# Patient Record
Sex: Female | Born: 1945 | Race: White | Hispanic: No | Marital: Married | Smoking: Never smoker
Health system: Southern US, Community
[De-identification: ages and names within clinical notes are randomized; demographics above are authoritative.]

## PROBLEM LIST (undated history)

## (undated) DIAGNOSIS — J309 Allergic rhinitis, unspecified: Secondary | ICD-10-CM

## (undated) DIAGNOSIS — I10 Essential (primary) hypertension: Secondary | ICD-10-CM

## (undated) DIAGNOSIS — H02889 Meibomian gland dysfunction of unspecified eye, unspecified eyelid: Secondary | ICD-10-CM

## (undated) DIAGNOSIS — M1990 Unspecified osteoarthritis, unspecified site: Secondary | ICD-10-CM

## (undated) DIAGNOSIS — R0600 Dyspnea, unspecified: Secondary | ICD-10-CM

## (undated) DIAGNOSIS — M67971 Unspecified disorder of synovium and tendon, right ankle and foot: Secondary | ICD-10-CM

## (undated) DIAGNOSIS — R591 Generalized enlarged lymph nodes: Secondary | ICD-10-CM

## (undated) DIAGNOSIS — E785 Hyperlipidemia, unspecified: Secondary | ICD-10-CM

## (undated) DIAGNOSIS — F419 Anxiety disorder, unspecified: Secondary | ICD-10-CM

## (undated) DIAGNOSIS — G4730 Sleep apnea, unspecified: Secondary | ICD-10-CM

## (undated) DIAGNOSIS — G4733 Obstructive sleep apnea (adult) (pediatric): Secondary | ICD-10-CM

## (undated) DIAGNOSIS — N950 Postmenopausal bleeding: Secondary | ICD-10-CM

## (undated) DIAGNOSIS — H02839 Dermatochalasis of unspecified eye, unspecified eyelid: Secondary | ICD-10-CM

## (undated) DIAGNOSIS — M722 Plantar fascial fibromatosis: Secondary | ICD-10-CM

## (undated) DIAGNOSIS — R011 Cardiac murmur, unspecified: Secondary | ICD-10-CM

## (undated) HISTORY — DX: Obstructive sleep apnea (adult) (pediatric): G47.33

## (undated) HISTORY — DX: Meibomian gland dysfunction of unspecified eye, unspecified eyelid: H02.889

## (undated) HISTORY — DX: Anxiety disorder, unspecified: F41.9

## (undated) HISTORY — PX: COLON SURGERY: SHX602

## (undated) HISTORY — PX: CATARACT EXTRACTION: SUR2

## (undated) HISTORY — DX: Allergic rhinitis, unspecified: J30.9

## (undated) HISTORY — DX: Essential (primary) hypertension: I10

## (undated) HISTORY — DX: Hyperlipidemia, unspecified: E78.5

## (undated) HISTORY — DX: Dermatochalasis of unspecified eye, unspecified eyelid: H02.839

---

## 1995-02-07 HISTORY — PX: CHOLECYSTECTOMY: SHX55

## 1999-02-07 HISTORY — PX: COLECTOMY: SHX59

## 2004-09-01 ENCOUNTER — Ambulatory Visit: Payer: Self-pay | Admitting: Internal Medicine

## 2006-01-18 ENCOUNTER — Ambulatory Visit: Payer: Self-pay | Admitting: Internal Medicine

## 2006-02-05 ENCOUNTER — Ambulatory Visit: Payer: Self-pay | Admitting: Gastroenterology

## 2006-02-06 HISTORY — PX: EYE SURGERY: SHX253

## 2006-02-06 HISTORY — PX: HYSTEROSCOPY: SHX211

## 2006-05-17 ENCOUNTER — Ambulatory Visit: Payer: Self-pay | Admitting: Obstetrics and Gynecology

## 2006-05-17 ENCOUNTER — Other Ambulatory Visit: Payer: Self-pay

## 2006-05-25 ENCOUNTER — Ambulatory Visit: Payer: Self-pay | Admitting: Obstetrics and Gynecology

## 2007-01-28 ENCOUNTER — Ambulatory Visit: Payer: Self-pay | Admitting: Internal Medicine

## 2008-03-30 ENCOUNTER — Ambulatory Visit: Payer: Self-pay | Admitting: Internal Medicine

## 2009-04-08 ENCOUNTER — Ambulatory Visit: Payer: Self-pay | Admitting: Gastroenterology

## 2010-02-06 HISTORY — PX: CARPAL TUNNEL RELEASE: SHX101

## 2010-02-24 ENCOUNTER — Ambulatory Visit: Payer: Self-pay | Admitting: Internal Medicine

## 2010-03-01 ENCOUNTER — Ambulatory Visit: Payer: Self-pay | Admitting: Unknown Physician Specialty

## 2010-03-09 ENCOUNTER — Ambulatory Visit: Payer: Self-pay | Admitting: Unknown Physician Specialty

## 2012-05-21 ENCOUNTER — Ambulatory Visit: Payer: Self-pay | Admitting: Internal Medicine

## 2013-02-06 HISTORY — PX: SHOULDER OPEN ROTATOR CUFF REPAIR: SHX2407

## 2013-02-06 HISTORY — PX: BLEPHAROPLASTY: SUR158

## 2013-11-17 ENCOUNTER — Emergency Department: Payer: Self-pay | Admitting: Emergency Medicine

## 2014-06-11 ENCOUNTER — Encounter: Payer: Self-pay | Admitting: Anesthesiology

## 2014-06-11 ENCOUNTER — Ambulatory Visit: Payer: Medicare Other | Admitting: Anesthesiology

## 2014-06-11 ENCOUNTER — Encounter
Admission: RE | Disposition: A | Discharge status: Home / Self Care | Payer: Self-pay | Source: Ambulatory Visit | Attending: Gastroenterology

## 2014-06-11 ENCOUNTER — Ambulatory Visit
Admission: RE | Admit: 2014-06-11 | Discharge: 2014-06-11 | Disposition: A | Discharge status: Home / Self Care | Payer: Medicare Other | Source: Ambulatory Visit | Attending: Gastroenterology | Admitting: Gastroenterology

## 2014-06-11 DIAGNOSIS — Z86010 Personal history of colonic polyps: Secondary | ICD-10-CM | POA: Insufficient documentation

## 2014-06-11 DIAGNOSIS — Z8371 Family history of colonic polyps: Secondary | ICD-10-CM | POA: Insufficient documentation

## 2014-06-11 DIAGNOSIS — I10 Essential (primary) hypertension: Secondary | ICD-10-CM | POA: Diagnosis present

## 2014-06-11 DIAGNOSIS — K5730 Diverticulosis of large intestine without perforation or abscess without bleeding: Secondary | ICD-10-CM | POA: Diagnosis not present

## 2014-06-11 DIAGNOSIS — Z882 Allergy status to sulfonamides status: Secondary | ICD-10-CM | POA: Diagnosis not present

## 2014-06-11 DIAGNOSIS — Z9889 Other specified postprocedural states: Secondary | ICD-10-CM | POA: Insufficient documentation

## 2014-06-11 DIAGNOSIS — Z792 Long term (current) use of antibiotics: Secondary | ICD-10-CM | POA: Diagnosis present

## 2014-06-11 DIAGNOSIS — Z881 Allergy status to other antibiotic agents status: Secondary | ICD-10-CM | POA: Insufficient documentation

## 2014-06-11 DIAGNOSIS — Z888 Allergy status to other drugs, medicaments and biological substances status: Secondary | ICD-10-CM | POA: Insufficient documentation

## 2014-06-11 DIAGNOSIS — D123 Benign neoplasm of transverse colon: Secondary | ICD-10-CM | POA: Insufficient documentation

## 2014-06-11 DIAGNOSIS — G4730 Sleep apnea, unspecified: Secondary | ICD-10-CM | POA: Diagnosis not present

## 2014-06-11 DIAGNOSIS — D124 Benign neoplasm of descending colon: Secondary | ICD-10-CM | POA: Insufficient documentation

## 2014-06-11 HISTORY — PX: COLONOSCOPY: SHX5424

## 2014-06-11 SURGERY — COLONOSCOPY
Anesthesia: Monitor Anesthesia Care

## 2014-06-11 MED ORDER — EPHEDRINE SULFATE 50 MG/ML IJ SOLN
INTRAMUSCULAR | Status: DC | PRN
  Administered 2014-06-11 (×2): 15 mg via INTRAVENOUS
  Administered 2014-06-11: 10 mg via INTRAVENOUS

## 2014-06-11 MED ORDER — LACTATED RINGERS IV SOLN
INTRAVENOUS | Status: DC | PRN
  Administered 2014-06-11: 10:00:00 via INTRAVENOUS

## 2014-06-11 MED ORDER — SODIUM CHLORIDE 0.9 % IV SOLN
INTRAVENOUS | Status: DC
  Administered 2014-06-11: 1000 mL via INTRAVENOUS

## 2014-06-11 MED ORDER — PROPOFOL 10 MG/ML IV BOLUS
INTRAVENOUS | Status: DC | PRN
  Administered 2014-06-11: 300 ug via INTRAVENOUS

## 2014-06-11 MED ORDER — MIDAZOLAM HCL 2 MG/2ML IJ SOLN
INTRAMUSCULAR | Status: DC | PRN
  Administered 2014-06-11: 1 mg via INTRAVENOUS

## 2014-06-11 MED ORDER — PROPOFOL INFUSION 10 MG/ML OPTIME
INTRAVENOUS | Status: DC | PRN
  Administered 2014-06-11: 100 ug/kg/min via INTRAVENOUS

## 2014-06-11 MED ORDER — FENTANYL CITRATE (PF) 100 MCG/2ML IJ SOLN
INTRAMUSCULAR | Status: DC | PRN
  Administered 2014-06-11: 50 ug via INTRAVENOUS

## 2014-06-11 NOTE — Anesthesia Postprocedure Evaluation (Signed)
  Anesthesia Post-op Note  Patient: Dawn Oliver  Procedure(s) Performed: Procedure(s): COLONOSCOPY (N/A)  Anesthesia type:MAC, General  Patient location: PACU  Post pain: Pain level controlled  Post assessment: Post-op Vital signs reviewed, Patient's Cardiovascular Status Stable, Respiratory Function Stable, Patent Airway and No signs of Nausea or vomiting  Post vital signs: Reviewed and stable  Last Vitals:  Filed Vitals:   06/11/14 1120  BP: 134/57  Pulse: 63  Temp:   Resp: 17    Level of consciousness: awake, alert  and patient cooperative  Complications: No apparent anesthesia complications

## 2014-06-11 NOTE — Transfer of Care (Signed)
Immediate Anesthesia Transfer of Care Note  Patient: Dawn Oliver  Procedure(s) Performed: Procedure(s): COLONOSCOPY (N/A)  Patient Location: PACU  Anesthesia Type:MAC  Level of Consciousness: awake and alert   Airway & Oxygen Therapy: Patient Spontanous Breathing  Post-op Assessment: Report given to RN  Post vital signs: Reviewed and stable  Last Vitals:  Filed Vitals:   06/11/14 1053  BP: 109/50  Pulse: 69  Temp: 37 C  Resp: 18    Complications: No apparent anesthesia complications

## 2014-06-11 NOTE — H&P (Signed)
Personal and family hx of polyps  Allergies: atorvastatin, biaxin lisinopril, zetia, sulfa, e-mycin  PMH; sleep apnea  PSH: rotator cuff repair  PE: VSS, NAD  HEENT:wnl CV:RRR Lungs:CTA Abdomen: NABS, soft, nontender, -HSM Ext:-C/C/E  Imp: Family Hx of polyps.  Plan: colonoscopy

## 2014-06-11 NOTE — Anesthesia Preprocedure Evaluation (Signed)
Anesthesia Evaluation  Patient identified by MRN, date of birth, ID band Patient awake    Reviewed: Allergy & Precautions, NPO status , Patient's Chart, lab work & pertinent test results, reviewed documented beta blocker date and time   Airway Mallampati: III  TM Distance: >3 FB Neck ROM: Full    Dental  (+) Chipped   Pulmonary          Cardiovascular hypertension, Pt. on medications and Pt. on home beta blockers     Neuro/Psych Anxiety    GI/Hepatic   Endo/Other    Renal/GU      Musculoskeletal   Abdominal   Peds  Hematology   Anesthesia Other Findings   Reproductive/Obstetrics                             Anesthesia Physical Anesthesia Plan  ASA: III  Anesthesia Plan: MAC and General   Post-op Pain Management:    Induction: Intravenous  Airway Management Planned: Nasal Cannula  Additional Equipment:   Intra-op Plan:   Post-operative Plan:   Informed Consent: I have reviewed the patients History and Physical, chart, labs and discussed the procedure including the risks, benefits and alternatives for the proposed anesthesia with the patient or authorized representative who has indicated his/her understanding and acceptance.     Plan Discussed with: CRNA and Surgeon  Anesthesia Plan Comments:         Anesthesia Quick Evaluation

## 2014-06-11 NOTE — Op Note (Signed)
[facility] Patient Name: Dawn Oliver Procedure Date: 06/11/2014 9:52 AM MRN: [id number] Account #: [id number] Date of Birth: 1945-05-29 Admit Type: Outpatient Age: 69 Room: [facility] ENDO ROOM 4 Gender: Female Note Status: Finalized Procedure:         Colonoscopy Indications:       Personal history of colonic polyps Providers:         [name]. Candace Cruise, MD Referring MD:      [name], MD (Referring MD) Medicines:         Monitored Anesthesia Care Complications:     No immediate complications. Procedure:         Pre-Anesthesia Assessment:                    - Prior to the procedure, a History and Physical was                     performed, and patient medications, allergies and                     sensitivities were reviewed. The patient's tolerance of                     previous anesthesia was reviewed.                    - The risks and benefits of the procedure and the sedation                     options and risks were discussed with the patient. All                     questions were answered and informed consent was obtained.                    - After reviewing the risks and benefits, the patient was                     deemed in satisfactory condition to undergo the procedure.                    After obtaining informed consent, the colonoscope was                     passed under direct vision. Throughout the procedure, the                     patient's blood pressure, pulse, and oxygen saturations                     were monitored continuously. The Colonoscope was                     introduced through the anus and advanced to the the cecum,                     identified by appendiceal orifice and ileocecal valve. The                     colonoscopy was performed without difficulty. The patient                     tolerated the procedure well. The quality of the bowel  preparation was fair. Findings:      Multiple  small-mouthed diverticula were found in the sigmoid colon and       in the descending colon.      Two sessile polyps were found in the transverse colon. The polyps were       small in size. These polyps were removed with a cold snare. Resection       and retrieval were complete.      A small polyp was found in the descending colon. The polyp was sessile.       The polyp was removed with a cold snare. Resection was complete, but the       polyp tissue was not retrieved.      The exam was otherwise without abnormality. Impression:        - Diverticulosis in the sigmoid colon and in the                     descending colon.                    - Two small polyps in the transverse colon. Resected and                     retrieved.                    - One small polyp in the descending colon. Complete                     resection. Polyp tissue not retrieved.                    - The examination was otherwise normal. Recommendation:    - Discharge patient to home.                    - Await pathology results.                    - Repeat colonoscopy in 3 years for surveillance based on                     pathology results.                    - The findings and recommendations were discussed with the                     patient. Procedure Code(s): --- Professional ---                    [phone], Colonoscopy, flexible; with removal of tumor(s),                     polyp(s), or other lesion(s) by snare technique Diagnosis Code(s): --- Professional ---                    D12.3, Benign neoplasm of transverse colon                    D12.4, Benign neoplasm of descending colon                    Z86.010, Personal history of colonic polyps                    K57.30, Diverticulosis of large intestine without  perforation or abscess without bleeding CPT copyright 2014 American Medical Association. All rights reserved. The codes documented in this report are preliminary and upon coder  review may  be revised to meet current compliance requirements. Hulen Luster, MD 06/11/2014 10:41:18 AM This report has been signed electronically. Number of Addenda: 0 Note Initiated On: 06/11/2014 9:52 AM Scope Withdrawal Time: 0 hours 11 minutes 32 seconds  Total Procedure Duration: 0 hours 15 minutes 29 seconds       [facility]

## 2014-06-12 ENCOUNTER — Encounter: Payer: Self-pay | Admitting: Gastroenterology

## 2014-06-12 LAB — SURGICAL PATHOLOGY

## 2014-08-19 ENCOUNTER — Other Ambulatory Visit: Payer: Self-pay | Admitting: Internal Medicine

## 2014-08-19 DIAGNOSIS — Z1231 Encounter for screening mammogram for malignant neoplasm of breast: Secondary | ICD-10-CM

## 2014-09-15 ENCOUNTER — Ambulatory Visit
Admission: RE | Admit: 2014-09-15 | Discharge: 2014-09-15 | Disposition: A | Discharge status: Home / Self Care | Payer: Medicare Other | Source: Ambulatory Visit | Attending: Internal Medicine | Admitting: Internal Medicine

## 2014-09-15 DIAGNOSIS — Z1231 Encounter for screening mammogram for malignant neoplasm of breast: Secondary | ICD-10-CM | POA: Diagnosis present

## 2015-02-11 ENCOUNTER — Other Ambulatory Visit: Payer: Self-pay | Admitting: Neurology

## 2015-02-11 DIAGNOSIS — R200 Anesthesia of skin: Secondary | ICD-10-CM

## 2015-02-19 ENCOUNTER — Ambulatory Visit
Admission: RE | Admit: 2015-02-19 | Discharge: 2015-02-19 | Disposition: A | Discharge status: Home / Self Care | Payer: Medicare Other | Source: Ambulatory Visit | Attending: Neurology | Admitting: Neurology

## 2015-02-19 DIAGNOSIS — R200 Anesthesia of skin: Secondary | ICD-10-CM

## 2015-10-25 ENCOUNTER — Other Ambulatory Visit: Payer: Self-pay | Admitting: Internal Medicine

## 2015-10-25 ENCOUNTER — Ambulatory Visit
Admission: RE | Admit: 2015-10-25 | Discharge: 2015-10-25 | Disposition: A | Discharge status: Home / Self Care | Payer: Medicare Other | Source: Ambulatory Visit | Attending: Internal Medicine | Admitting: Internal Medicine

## 2015-10-25 DIAGNOSIS — R600 Localized edema: Secondary | ICD-10-CM | POA: Insufficient documentation

## 2015-11-11 ENCOUNTER — Encounter (INDEPENDENT_AMBULATORY_CARE_PROVIDER_SITE_OTHER): Payer: Self-pay | Admitting: Vascular Surgery

## 2015-11-11 ENCOUNTER — Ambulatory Visit (INDEPENDENT_AMBULATORY_CARE_PROVIDER_SITE_OTHER): Payer: Medicare Other | Admitting: Vascular Surgery

## 2015-11-11 DIAGNOSIS — M79604 Pain in right leg: Secondary | ICD-10-CM

## 2015-11-11 DIAGNOSIS — I890 Lymphedema, not elsewhere classified: Secondary | ICD-10-CM | POA: Diagnosis not present

## 2015-11-11 DIAGNOSIS — M79605 Pain in left leg: Secondary | ICD-10-CM

## 2015-11-11 DIAGNOSIS — I872 Venous insufficiency (chronic) (peripheral): Secondary | ICD-10-CM | POA: Diagnosis not present

## 2015-11-13 NOTE — Progress Notes (Signed)
MRN : [id number]  Dawn Oliver is a 70 y.o. (Nov 30, 1945) female who presents with chief complaint of  Chief Complaint  Patient presents with  . Leg Swelling    NP, right leg swelling  .  History of Present Illness: Patient is seen for evaluation of leg pain and leg swelling. The patient first noticed the swelling remotely. The swelling is associated with pain and discoloration. The pain and swelling worsens with prolonged dependency and improves with elevation. The pain is unrelated to activity.  The patient notes that in the morning the legs are significantly improved but they steadily worsened throughout the course of the day. The patient also notes a steady worsening of the discoloration in the ankle and shin area.   The patient denies claudication symptoms.  The patient denies symptoms consistent with rest pain.  The patient denies and extensive history of DJD and LS spine disease.  The patient has no had any past angiography, interventions or vascular surgery.  Elevation makes the leg symptoms better, dependency makes them much worse. There is no history of ulcerations. The patient denies any recent changes in medications.  The patient has not been wearing graduated compression.  The patient denies a history of DVT or PE. There is no prior history of phlebitis. There is no history of primary lymphedema.  No history of malignancies. No history of trauma or groin or pelvic surgery. There is no history of radiation treatment to the groin or pelvis  The patient denies amaurosis fugax or recent TIA symptoms. There are no recent neurological changes noted. The patient denies recent episodes of angina or shortness of breath.   Current Outpatient Prescriptions  Medication Sig Dispense Refill  . albuterol (PROVENTIL HFA;VENTOLIN HFA) 108 (90 Base) MCG/ACT inhaler Inhale into the lungs.    Marland Kitchen aspirin EC 81 MG tablet Take by mouth.    . DULoxetine (CYMBALTA) 60 MG capsule Take 60 mg by  mouth daily.    . fluticasone (FLONASE) 50 MCG/ACT nasal spray Place 2 sprays into both nostrils daily.    Marland Kitchen losartan-hydrochlorothiazide (HYZAAR) 100-25 MG per tablet Take 1 tablet by mouth every morning.    . metoprolol succinate (TOPROL-XL) 50 MG 24 hr tablet Take 50 mg by mouth daily after breakfast.     No current facility-administered medications for this visit.     Past Medical History:  Diagnosis Date  . Hyperlipidemia   . Hypertension     Past Surgical History:  Procedure Laterality Date  . CARPAL TUNNEL RELEASE Bilateral   . COLON SURGERY     colonoscopy  . COLONOSCOPY N/A 06/11/2014   Procedure: COLONOSCOPY;  Surgeon: Hulen Luster, MD;  Location: [facility] ENDOSCOPY;  Service: Gastroenterology;  Laterality: N/A;  . SHOULDER OPEN ROTATOR CUFF REPAIR Right 2015    Social History Social History  Substance Use Topics  . Smoking status: Never Smoker  . Smokeless tobacco: Never Used  . Alcohol use No    Family History Family History  Problem Relation Age of Onset  . Breast cancer Maternal Aunt 80  . Diabetes Mother   . Hypertension Mother   . Peripheral Artery Disease Mother   . Stroke Mother   . Varicose Veins Mother   . Congestive Heart Failure Father   . Heart attack Father     Allergies  Allergen Reactions  . Erythromycin Other (See Comments)    Caused crazy dreams     REVIEW OF SYSTEMS (Negative unless checked)  Constitutional: []   Weight loss  [] Fever  [] Chills Cardiac: [] Chest pain   [] Chest pressure   [] Palpitations   [] Shortness of breath when laying flat   [] Shortness of breath at rest   [] Shortness of breath with exertion. Vascular:  [] Pain in legs with walking   [] Pain in legs at rest   [] History of DVT   [] Phlebitis   [x] Swelling in legs   [] Varicose veins Pulmonary:   [] Uses home oxygen   [] Productive cough   [] Hemoptysis   [] Wheeze  [] COPD   [] Asthma Neurologic:  [] Dizziness  [] Blackouts   [] Seizures   [] History of stroke   [] History of TIA  [] Aphasia    [] Temporary blindness   [] Dysphagia   [] Weakness or numbness in arms   [] Weakness or numbness in legs Musculoskeletal:  [] Arthritis   [] Joint swelling   [] Joint pain   [] Low back pain Hematologic:  [] Easy bruising  [] Easy bleeding   [] Hypercoagulable state   [] Anemic   Gastrointestinal:   [] Vomiting  [] Gastroesophageal reflux/heartburn   [] Abdominal pain Genitourinary:  [] Chronic kidney disease   [] Difficult urination  [] Frequent urination  [] Burning with urination   [] Hematuria Skin:  [] Rashes   [] Ulcers   [] Wounds Psychological:  []  anxiety   []  depression.  Physical Examination  BP 132/63 (BP Location: Right Arm)   Pulse 63   Resp 16   Ht 5\' 3"  (1.6 m)   Wt (!) 310 lb (140.6 kg)   BMI 54.91 kg/m  Gen:  WD/WN, NAD Head: [place]/AT, No temporalis wasting. Ear/Nose/Throat: Hearing grossly intact, nares w/o erythema or drainage, trachea midline Eyes: PERRLA.  Sclera non-icteric Neck: Supple.  No JVD.  Pulmonary:  Good air movement, no use of accessory muscles. Clear bilaterally Cardiac: RRR, normal S1, S2 Vascular:  3+ edema bilateral lower extremities Vessel Right Left  Radial Palpable Palpable  Ulnar Palpable Palpable  Brachial Palpable Palpable  Carotid Palpable, without bruit Palpable, without bruit  Femoral Palpable Palpable  Popliteal Palpable Palpable  PT Palpable Palpable  DP Palpable Palpable   Gastrointestinal: Non-tender/non-distended. No guarding.  Musculoskeletal: M/S 5/5 throughout.  No deformity or atrophy.  Neurologic: CN 2-12 intact. Pain and light touch intact in extremities.  Symmetrical.  Speech is fluent.  Psychiatric: Judgment intact, Mood & affect appropriate for pt's clinical situation. Dermatologic: No rashes or ulcers noted.  No cellulitis or open wounds.    Labs No results found for this or any previous visit (from the past 2160 hour(s)).  Radiology US Venous Img Lower Unilateral Right  Result Date: 10/25/2015 CLINICAL DATA:  Bilateral lower  extremity edema, right side greater than left. EXAM: RIGHT LOWER EXTREMITY VENOUS DOPPLER ULTRASOUND TECHNIQUE: Gray-scale sonography with graded compression, as well as color Doppler and duplex ultrasound, were performed to evaluate the deep venous system from the level of the common femoral vein through the popliteal and proximal calf veins. Spectral Doppler was utilized to evaluate flow at rest and with distal augmentation maneuvers. COMPARISON:  None. FINDINGS: Left common femoral vein is patent without thrombus. Normal compressibility, augmentation and color Doppler flow in the right common femoral vein, right femoral vein and right popliteal vein. The right saphenofemoral junction is patent. Right profunda femoral vein is patent without thrombus. Visualized right deep calf veins are patent without thrombus. Subcutaneous edema in the right calf. IMPRESSION: Negative for deep venous thrombosis in right lower extremity. Electronically Signed   By: Markus Daft M.D.   On: 10/25/2015 16:32    Assessment/Plan  Venous Insufficiency: Recommend:  I have had  a long discussion with the patient regarding swelling and why it  causes symptoms.  Patient will begin wearing graduated compression stockings class 1 (20-30 mmHg) on a daily basis a prescription was given. The patient will  beginning wearing the stockings first thing in the morning and removing them in the evening. The patient is instructed specifically not to sleep in the stockings.   In addition, behavioral modification will be initiated.  This will include frequent elevation, use of over the counter pain medications and exercise such as walking.  I have reviewed systemic causes for chronic edema such as liver, kidney and cardiac etiologies.  The patient denies problems with these organ systems.    Consideration for a lymph pump will also be made based upon the effectiveness of conservative therapy.  This would help to improve the edema control and  prevent sequela such as ulcers and infections   Patient should undergo duplex ultrasound of the venous system to ensure that DVT or reflux is not present.  The patient will follow-up with me after the ultrasound.   2.  Lymphedema Recommend:  See above plan  3.  Hypertension  4.  Hypercholesterolemia     Hortencia Pilar, MD  11/13/2015 2:17 PM    This note was created with Dragon medical transcription system.  Any errors from dictation are purely unintentional

## 2015-12-26 ENCOUNTER — Ambulatory Visit
Admission: EM | Admit: 2015-12-26 | Discharge: 2015-12-26 | Disposition: A | Discharge status: Home / Self Care | Payer: Medicare Other | Attending: Family Medicine | Admitting: Family Medicine

## 2015-12-26 DIAGNOSIS — J0100 Acute maxillary sinusitis, unspecified: Secondary | ICD-10-CM

## 2015-12-26 HISTORY — DX: Morbid (severe) obesity due to excess calories: E66.01

## 2015-12-26 MED ORDER — AMOXICILLIN-POT CLAVULANATE 875-125 MG PO TABS
1.0000 | ORAL_TABLET | Freq: Two times a day (BID) | ORAL | 0 refills | Status: DC
Start: 2015-12-26 — End: 2017-05-15

## 2015-12-26 MED ORDER — FEXOFENADINE-PSEUDOEPHED ER 180-240 MG PO TB24: 1.0000 | ORAL_TABLET | Freq: Every day | ORAL | 0 refills | Status: DC

## 2015-12-26 NOTE — ED Provider Notes (Signed)
[facility]    CSN: [id number] Arrival date & time: 12/26/15  1332     History   Chief Complaint Chief Complaint  Patient presents with  . Facial Pain    HPI Dawn Oliver is a 70 y.o. female.   Patient reports postnasal drainage for about 10 days. Start off the foot was cold throat is progressed She has nasal discharge drainage and pressure behind her maxillary sinus area. He reports a significant amount discomfort. She is not allergic to anything but states that erythromycin orally) nightmares she does not smoke, has history hypertension and past family medical history pertinent to today's visit. She's had colon surgery colonoscopy rotator cuff surgery cataract surgery and carpal tunnel release. She has chronic venous insufficiency and lymphedema and bilateral leg pain hyperlipidemia hypertension and morbid obesity.   The history is provided by the patient. No language interpreter was used.  URI  Presenting symptoms: congestion and facial pain   Severity:  Moderate Duration:  10 days Timing:  Constant Progression:  Worsening Chronicity:  New Relieved by:  Nothing Ineffective treatments:  None tried Associated symptoms: headaches and sinus pain   Risk factors: no chronic cardiac disease, no immunosuppression and no recent illness     Past Medical History:  Diagnosis Date  . Hyperlipidemia   . Hypertension   . Morbid obesity [facility])     Patient Active Problem List   Diagnosis Date Noted  . Chronic venous insufficiency 11/11/2015  . Lymphedema 11/11/2015  . Bilateral leg pain 11/11/2015    Past Surgical History:  Procedure Laterality Date  . CARPAL TUNNEL RELEASE Bilateral   . CATARACT EXTRACTION    . CHOLECYSTECTOMY    . COLON SURGERY     colonoscopy  . COLONOSCOPY N/A 06/11/2014   Procedure: COLONOSCOPY;  Surgeon: Hulen Luster, MD;  Location: [facility] ENDOSCOPY;  Service: Gastroenterology;  Laterality: N/A;  . SHOULDER OPEN ROTATOR CUFF REPAIR Right  2015    OB History    No data available       Home Medications    Prior to Admission medications   Medication Sig Start Date End Date Taking? Authorizing Provider  meloxicam (MOBIC) 15 MG tablet Take 15 mg by mouth daily.   Yes Historical Provider, MD  albuterol (PROVENTIL HFA;VENTOLIN HFA) 108 (90 Base) MCG/ACT inhaler Inhale into the lungs.    Historical Provider, MD  amoxicillin-clavulanate (AUGMENTIN) 875-125 MG tablet Take 1 tablet by mouth 2 (two) times daily. 12/26/15   Frederich Cha, MD  aspirin EC 81 MG tablet Take by mouth.    Historical Provider, MD  DULoxetine (CYMBALTA) 60 MG capsule Take 60 mg by mouth daily.    Historical Provider, MD  fexofenadine-pseudoephedrine (ALLEGRA-D ALLERGY & CONGESTION) 180-240 MG 24 hr tablet Take 1 tablet by mouth daily. 12/26/15   Frederich Cha, MD  fluticasone (FLONASE) 50 MCG/ACT nasal spray Place 2 sprays into both nostrils daily.    Historical Provider, MD  losartan-hydrochlorothiazide (HYZAAR) 100-25 MG per tablet Take 1 tablet by mouth every morning.    Historical Provider, MD  metoprolol succinate (TOPROL-XL) 50 MG 24 hr tablet Take 50 mg by mouth daily after breakfast.    Historical Provider, MD    Family History Family History  Problem Relation Age of Onset  . Breast cancer Maternal Aunt 80  . Diabetes Mother   . Hypertension Mother   . Peripheral Artery Disease Mother   . Stroke Mother   . Varicose Veins Mother   .  Congestive Heart Failure Father   . Heart attack Father     Social History Social History  Substance Use Topics  . Smoking status: Never Smoker  . Smokeless tobacco: Never Used  . Alcohol use No     Allergies   Erythromycin   Review of Systems Review of Systems  HENT: Positive for congestion and sinus pain.   Neurological: Positive for headaches.  All other systems reviewed and are negative.    Physical Exam Triage Vital Signs ED Triage Vitals  Enc Vitals Group     BP 12/26/15 1432 (!) 153/52       Pulse Rate 12/26/15 1432 72     Resp 12/26/15 1432 18     Temp 12/26/15 1432 98.8 F (37.1 C)     Temp src --      SpO2 12/26/15 1432 100 %     Weight 12/26/15 1434 (!) 314 lb (142.4 kg)     Height 12/26/15 1434 5\' 4"  (1.626 m)     Head Circumference --      Peak Flow --      Pain Score 12/26/15 1437 5     Pain Loc --      Pain Edu? --      Excl. in [place]? --    No data found.   Updated Vital Signs BP (!) 153/52 (BP Location: Left Arm)   Pulse 72   Temp 98.8 F (37.1 C)   Resp 18   Ht 5\' 4"  (1.626 m)   Wt (!) 314 lb (142.4 kg)   SpO2 100%   BMI 53.90 kg/m   Visual Acuity Right Eye Distance:   Left Eye Distance:   Bilateral Distance:    Right Eye Near:   Left Eye Near:    Bilateral Near:     Physical Exam  Constitutional: She is oriented to person, place, and time. She appears well-developed and well-nourished.  HENT:  Head: Normocephalic and atraumatic.  Right Ear: Hearing, tympanic membrane, external ear and ear canal normal.  Left Ear: Hearing, tympanic membrane, external ear and ear canal normal.  Nose: Mucosal edema and sinus tenderness present. No nose lacerations, nasal deformity or septal deviation. Right sinus exhibits maxillary sinus tenderness. Left sinus exhibits maxillary sinus tenderness. Left sinus exhibits no frontal sinus tenderness.  Mouth/Throat: Uvula is midline and mucous membranes are normal. No posterior oropharyngeal edema or posterior oropharyngeal erythema.  Eyes: EOM are normal. Pupils are equal, round, and reactive to light.  Neck: Normal range of motion. Neck supple.  Cardiovascular: Normal rate.   Pulmonary/Chest: Effort normal and breath sounds normal.  Neurological: She is alert and oriented to person, place, and time.  Skin: Skin is warm.  Psychiatric: She has a normal mood and affect.  Vitals reviewed.    UC Treatments / Results  Labs (all labs ordered are listed, but only abnormal results are displayed) Labs Reviewed -  No data to display  EKG  EKG Interpretation None       Radiology No results found.  Procedures Procedures (including critical care time)  Medications Ordered in UC Medications - No data to display   Initial Impression / Assessment and Plan / UC Course  I have reviewed the triage vital signs and the nursing notes.  Pertinent labs & imaging results that were available during my care of the patient were reviewed by me and considered in my medical decision making (see chart for details).  Clinical Course     We'll  place patient on Augmentin 875 one tablet twice a day Allegra-D daily. She has Flonase at home. Follow-up PCP if not better in a week.  Final Clinical Impressions(s) / UC Diagnoses   Final diagnoses:  Acute maxillary sinusitis, recurrence not specified    New Prescriptions Discharge Medication List as of 12/26/2015  3:56 PM    START taking these medications   Details  amoxicillin-clavulanate (AUGMENTIN) 875-125 MG tablet Take 1 tablet by mouth 2 (two) times daily., Starting Sun 12/26/2015, Normal    fexofenadine-pseudoephedrine (ALLEGRA-D ALLERGY & CONGESTION) 180-240 MG 24 hr tablet Take 1 tablet by mouth daily., Starting Sun 12/26/2015, Print         Frederich Cha, MD 12/26/15 [phone]

## 2015-12-26 NOTE — ED Triage Notes (Signed)
Pt seen for URI by PCP this week with no treatment. Now thinks it has gone into a sinus infection. Facial and teeth pain and blowing bloody secretions from nose. Headache 5/10

## 2016-02-14 ENCOUNTER — Encounter (INDEPENDENT_AMBULATORY_CARE_PROVIDER_SITE_OTHER): Payer: Medicare Other

## 2016-02-14 ENCOUNTER — Ambulatory Visit (INDEPENDENT_AMBULATORY_CARE_PROVIDER_SITE_OTHER): Payer: Medicare Other | Admitting: Vascular Surgery

## 2016-06-21 ENCOUNTER — Other Ambulatory Visit: Payer: Self-pay | Admitting: Internal Medicine

## 2016-06-21 DIAGNOSIS — Z1231 Encounter for screening mammogram for malignant neoplasm of breast: Secondary | ICD-10-CM

## 2016-07-05 ENCOUNTER — Ambulatory Visit
Admission: RE | Admit: 2016-07-05 | Discharge: 2016-07-05 | Disposition: A | Discharge status: Home / Self Care | Payer: Medicare Other | Source: Ambulatory Visit | Attending: Internal Medicine | Admitting: Internal Medicine

## 2016-07-05 DIAGNOSIS — Z1231 Encounter for screening mammogram for malignant neoplasm of breast: Secondary | ICD-10-CM | POA: Diagnosis present

## 2016-07-11 ENCOUNTER — Other Ambulatory Visit: Payer: Self-pay | Admitting: Physical Medicine and Rehabilitation

## 2016-07-11 DIAGNOSIS — M5136 Other intervertebral disc degeneration, lumbar region: Secondary | ICD-10-CM

## 2016-07-11 DIAGNOSIS — M5416 Radiculopathy, lumbar region: Secondary | ICD-10-CM

## 2016-07-11 DIAGNOSIS — M48062 Spinal stenosis, lumbar region with neurogenic claudication: Secondary | ICD-10-CM

## 2016-07-22 ENCOUNTER — Ambulatory Visit
Admission: RE | Admit: 2016-07-22 | Discharge: 2016-07-22 | Disposition: A | Discharge status: Home / Self Care | Payer: Medicare Other | Source: Ambulatory Visit | Attending: Physical Medicine and Rehabilitation | Admitting: Physical Medicine and Rehabilitation

## 2016-07-22 DIAGNOSIS — M5136 Other intervertebral disc degeneration, lumbar region: Secondary | ICD-10-CM

## 2016-07-22 DIAGNOSIS — M48062 Spinal stenosis, lumbar region with neurogenic claudication: Secondary | ICD-10-CM

## 2016-07-22 DIAGNOSIS — M5416 Radiculopathy, lumbar region: Secondary | ICD-10-CM

## 2017-05-15 ENCOUNTER — Ambulatory Visit
Admission: EM | Admit: 2017-05-15 | Discharge: 2017-05-15 | Disposition: A | Discharge status: Home / Self Care | Payer: Medicare Other | Attending: Family Medicine | Admitting: Family Medicine

## 2017-05-15 ENCOUNTER — Encounter: Payer: Self-pay | Admitting: *Deleted

## 2017-05-15 DIAGNOSIS — H1033 Unspecified acute conjunctivitis, bilateral: Secondary | ICD-10-CM

## 2017-05-15 DIAGNOSIS — J029 Acute pharyngitis, unspecified: Secondary | ICD-10-CM | POA: Diagnosis not present

## 2017-05-15 DIAGNOSIS — R05 Cough: Secondary | ICD-10-CM | POA: Diagnosis not present

## 2017-05-15 DIAGNOSIS — J40 Bronchitis, not specified as acute or chronic: Secondary | ICD-10-CM | POA: Diagnosis not present

## 2017-05-15 LAB — RAPID STREP SCREEN (MED CTR MEBANE ONLY): Streptococcus, Group A Screen (Direct): NEGATIVE

## 2017-05-15 MED ORDER — DOXYCYCLINE HYCLATE 100 MG PO CAPS: 100.0000 mg | ORAL_CAPSULE | Freq: Two times a day (BID) | ORAL | 0 refills | Status: DC

## 2017-05-15 MED ORDER — POLYMYXIN B-TRIMETHOPRIM 10000-0.1 UNIT/ML-% OP SOLN: 1.0000 [drp] | Freq: Four times a day (QID) | OPHTHALMIC | 0 refills | Status: AC

## 2017-05-15 NOTE — Discharge Instructions (Signed)
Meds as prescribed. ° °Take care ° °Dr. Arren Laminack  °

## 2017-05-15 NOTE — ED Triage Notes (Signed)
Pt c/o sore throat, productive cough and tightness to the chest. Pt has had this symptoms for over a week. No fever reported.

## 2017-05-15 NOTE — ED Provider Notes (Signed)
[facility]   CSN: [id number] Arrival date & time: 05/15/17  1255  History   Chief Complaint Chief Complaint  Patient presents with  . Cough   HPI  72 year old female presents with cough.  Been sick for the past week. Has had sore throat, productive cough, ear pain. Worsening. Also reports eye drainage/discharge and matting. No fever.  No known exacerbating or relieving factors. No other complaints.  Past Medical History:  Diagnosis Date  . Hyperlipidemia   . Hypertension   . Morbid obesity [facility])    Patient Active Problem List   Diagnosis Date Noted  . Chronic venous insufficiency 11/11/2015  . Lymphedema 11/11/2015  . Bilateral leg pain 11/11/2015   Past Surgical History:  Procedure Laterality Date  . CARPAL TUNNEL RELEASE Bilateral   . CATARACT EXTRACTION    . CHOLECYSTECTOMY    . COLON SURGERY     colonoscopy  . COLONOSCOPY N/A 06/11/2014   Procedure: COLONOSCOPY;  Surgeon: Hulen Luster, MD;  Location: [facility] ENDOSCOPY;  Service: Gastroenterology;  Laterality: N/A;  . SHOULDER OPEN ROTATOR CUFF REPAIR Right 2015   OB History   None    Home Medications    Prior to Admission medications   Medication Sig Start Date End Date Taking? Authorizing Provider  albuterol (PROVENTIL HFA;VENTOLIN HFA) 108 (90 Base) MCG/ACT inhaler Inhale into the lungs.   Yes [provider]  aspirin EC 81 MG tablet Take by mouth.   Yes [provider]  DULoxetine (CYMBALTA) 60 MG capsule Take 60 mg by mouth daily.   Yes [provider]  fluticasone (FLONASE) 50 MCG/ACT nasal spray Place 2 sprays into both nostrils daily.   Yes [provider]  losartan-hydrochlorothiazide (HYZAAR) 100-25 MG per tablet Take 1 tablet by mouth every morning.   Yes [provider]  meloxicam (MOBIC) 15 MG tablet Take 15 mg by mouth daily.   Yes [provider]  metoprolol succinate (TOPROL-XL) 50 MG 24 hr tablet Take 50 mg by mouth daily after  breakfast.   Yes [provider]  doxycycline (VIBRAMYCIN) 100 MG capsule Take 1 capsule (100 mg total) by mouth 2 (two) times daily. 05/15/17   Coral Spikes, DO  trimethoprim-polymyxin b (POLYTRIM) ophthalmic solution Place 1 drop into both eyes every 6 (six) hours for 5 days. 05/15/17 05/20/17  Coral Spikes, DO    Family History Family History  Problem Relation Age of Onset  . Breast cancer Maternal Aunt 80  . Diabetes Mother   . Hypertension Mother   . Peripheral Artery Disease Mother   . Stroke Mother   . Varicose Veins Mother   . Congestive Heart Failure Father   . Heart attack Father     Social History Social History   Tobacco Use  . Smoking status: Never Smoker  . Smokeless tobacco: Never Used  Substance Use Topics  . Alcohol use: No  . Drug use: No     Allergies   Erythromycin   Review of Systems Review of Systems  Constitutional: Negative for fever.  HENT: Positive for ear pain and sore throat.   Eyes: Positive for discharge.  Respiratory: Positive for cough.    Physical Exam Triage Vital Signs ED Triage Vitals  Enc Vitals Group     BP 05/15/17 1316 121/62     Pulse Rate 05/15/17 1316 71     Resp 05/15/17 1316 16     Temp 05/15/17 1316 98.1 F (36.7 C)  Temp Source 05/15/17 1316 Oral     SpO2 05/15/17 1316 98 %     Weight 05/15/17 1313 (!) 310 lb (140.6 kg)     Height 05/15/17 1313 5\' 4"  (1.626 m)     Head Circumference --      Peak Flow --      Pain Score 05/15/17 1312 0     Pain Loc --      Pain Edu? --      Excl. in [place]? --    Updated Vital Signs BP 121/62 (BP Location: Left Arm)   Pulse 71   Temp 98.1 F (36.7 C) (Oral)   Resp 16   Ht 5\' 4"  (1.626 m)   Wt (!) 310 lb (140.6 kg)   SpO2 98%   BMI 53.21 kg/m  Physical Exam  Constitutional: She appears well-developed. No distress.  HENT:  Head: Normocephalic and atraumatic.  Eyes:  Mild conjunctival injection bilaterally.  Purulent discharge noted.  Cardiovascular: Normal  rate and regular rhythm.  Pulmonary/Chest: Effort normal and breath sounds normal. She has no wheezes. She has no rales.  Psychiatric: She has a normal mood and affect. Her behavior is normal.  Nursing note and vitals reviewed.  UC Treatments / Results  Labs (all labs ordered are listed, but only abnormal results are displayed) Labs Reviewed  RAPID STREP SCREEN ([facility] ONLY)  CULTURE, GROUP A STREP [facility])    EKG None Radiology No results found.  Procedures Procedures (including critical care time)  Medications Ordered in UC Medications - No data to display   Initial Impression / Assessment and Plan / UC Course  I have reviewed the triage vital signs and the nursing notes.  Pertinent labs & imaging results that were available during my care of the patient were reviewed by me and considered in my medical decision making (see chart for details).     72 year old female presents with bronchitis and conjunctivitis. Treating as below.  Final Clinical Impressions(s) / UC Diagnoses   Final diagnoses:  Bronchitis  Acute conjunctivitis of both eyes, unspecified acute conjunctivitis type    ED Discharge Orders        Ordered    doxycycline (VIBRAMYCIN) 100 MG capsule  2 times daily     05/15/17 1351    trimethoprim-polymyxin b (POLYTRIM) ophthalmic solution  Every 6 hours     05/15/17 1351     Controlled Substance Prescriptions [place] Controlled Substance Registry consulted? Not Applicable   Coral Spikes, DO 05/15/17 1515

## 2017-05-18 LAB — CULTURE, GROUP A STREP (THRC)

## 2017-06-06 DIAGNOSIS — N950 Postmenopausal bleeding: Secondary | ICD-10-CM

## 2017-06-06 HISTORY — DX: Postmenopausal bleeding: N95.0

## 2017-06-29 ENCOUNTER — Encounter
Admission: RE | Admit: 2017-06-29 | Discharge: 2017-06-29 | Disposition: A | Discharge status: Home / Self Care | Payer: Medicare Other | Source: Ambulatory Visit | Attending: Obstetrics and Gynecology | Admitting: Obstetrics and Gynecology

## 2017-06-29 ENCOUNTER — Other Ambulatory Visit: Payer: Self-pay

## 2017-06-29 DIAGNOSIS — Z01810 Encounter for preprocedural cardiovascular examination: Secondary | ICD-10-CM | POA: Diagnosis present

## 2017-06-29 DIAGNOSIS — Z01812 Encounter for preprocedural laboratory examination: Secondary | ICD-10-CM | POA: Insufficient documentation

## 2017-06-29 HISTORY — DX: Plantar fascial fibromatosis: M72.2

## 2017-06-29 HISTORY — DX: Dyspnea, unspecified: R06.00

## 2017-06-29 HISTORY — DX: Unspecified disorder of synovium and tendon, right ankle and foot: M67.971

## 2017-06-29 HISTORY — DX: Sleep apnea, unspecified: G47.30

## 2017-06-29 HISTORY — DX: Generalized enlarged lymph nodes: R59.1

## 2017-06-29 HISTORY — DX: Cardiac murmur, unspecified: R01.1

## 2017-06-29 HISTORY — DX: Postmenopausal bleeding: N95.0

## 2017-06-29 HISTORY — DX: Unspecified osteoarthritis, unspecified site: M19.90

## 2017-06-29 LAB — TYPE AND SCREEN
ABO/RH(D): O NEG
ANTIBODY SCREEN: NEGATIVE

## 2017-06-29 NOTE — H&P (Signed)
Dawn Oliver is a 72 y.o. female here for Follow-up (u/s follow up -- preop) .  HPI:  Pt presents for an ultrasound followup   Hx of PMB in 2008, had a D&C and polypectomy (cervical stenosis precluded office EMBx) with Dr. Esmeralda Links. Pathology was benign polyp in 2008 after record review.  Menopause at age 63  Uterus measures 8x4x5cm, anteverted No ovaries visualized ES is 9.28mm  Past Medical History:  has a past medical history of Allergic rhinitis (12/11/2013), Anxiety (08/19/2014), CME (cystoid macular edema), Colon polyps (02/05/2006), Diverticulosis (04/08/2009), History of carpal tunnel release of both wrists (03/2010), History of pancreatitis (1995), adenomatous colonic polyps (10/1999), Hyperlipidemia (12/11/2013), Hypertension (12/11/2013), MGD (meibomian gland dysfunction), Obstructive sleep apnea (12/11/2013), Postmenopausal (1996), Primary osteoarthritis of right knee (07/08/2015), Spinal stenosis, and Tubular adenoma of colon, unspecified (06/11/2014).  Past Surgical History:  has a past surgical history that includes Endoscopic Carpal Tunnel Release (Bilateral, 03/2010); cholecystectomy (1997); Cataract extraction (Bilateral); blepharoplasty upper eyelid (Bilateral, 10/24/13); resection villous adenoma colon  (10/1999); Hysteroscopy (april 2008); Colonoscopy (04/08/2009, 02/05/2006, 02/03/2003, 12/17/2000); arthroscopic rotator cuff repair (Right, 02/25/2014); and Colonoscopy (06/11/2014). Family History: family history includes Coronary Artery Disease (Blocked arteries around heart) (age of onset: 41) in her father; Diabetes type II in her mother; High blood pressure (Hypertension) in her brother, brother, father, and mother; Macular degeneration in her mother; Migraines in her daughter; Myocardial Infarction (Heart attack) in her brother; Other in her brother; Stroke in her brother, brother, and mother. Social History:  reports that she has never smoked. She has never used smokeless tobacco. She  reports that she does not drink alcohol or use drugs. OB/GYN History:  OB History    Gravida  2   Para  2   Term  2   Preterm      AB      Living  2     SAB      TAB      Ectopic      Molar      Multiple      Live Births             Allergies: is allergic to atorvastatin; biaxin [clarithromycin]; femhrt [norethindrone ac-eth estradiol]; lisinopril; pravastatin; simvastatin; zetia [ezetimibe]; sulfa (sulfonamide antibiotics); and erythromycin. Medications:  Current Outpatient Medications:  .  acetaminophen (TYLENOL) 325 MG tablet, Take 650 mg by mouth every 4 (four) hours as needed for Pain., Disp: , Rfl:  .  albuterol (VENTOLIN HFA) 90 mcg/actuation inhaler, Inhale 2 inhalations into the lungs every 4 (four) hours as needed for Wheezing, Disp: 1 Inhaler, Rfl: 5 .  aspirin 81 MG EC tablet, Take 81 mg by mouth once daily., Disp: , Rfl:  .  benzonatate (TESSALON) 100 MG capsule, Take 1-2 capsules (100-200 mg total) by mouth 3 (three) times daily as needed for Cough, Disp: 60 capsule, Rfl: 1 .  DULoxetine (CYMBALTA) 60 MG DR capsule, Take 1 capsule (60 mg total) by mouth once daily, Disp: 90 capsule, Rfl: 3 .  fluticasone propionate (FLONASE) 50 mcg/actuation nasal spray, Place 1-2 sprays into both nostrils once daily as needed for Allergies, Disp: 48 g, Rfl: 3 .  liraglutide (VICTOZA) 0.6 mg/0.1 mL (18 mg/3 mL) pen injector, Inject 0.3 mLs (1.8 mg total) subcutaneously once daily as directed, Disp: 27 mL, Rfl: 3 .  losartan-hydrochlorothiazide (HYZAAR) 100-25 mg tablet, Take 1 tablet by mouth once daily For blood pressure, Disp: 90 tablet, Rfl: 3 .  meloxicam (MOBIC) 15 MG tablet, TAKE 1  TABLET BY MOUTH  DAILY, Disp: 90 tablet, Rfl: 0 .  metoprolol succinate (TOPROL-XL) 50 MG XL tablet, Take 1 tablet (50 mg total) by mouth once daily For blood pressure, Disp: 90 tablet, Rfl: 3 .  pen needle, diabetic 31 gauge x 5/16" needle, Use once daily. With Victoza, Disp: 100  each, Rfl: 3 .  TURMERIC ORAL, Take 500 mg by mouth once daily  , Disp: , Rfl:   Review of Systems: No SOB, no palpitations or chest pain, no new lower extremity edema, no nausea or vomiting or bowel or bladder complaints. See HPI for gyn specific ROS.   Exam:      Vitals:   06/21/17 1442  BP: 140/70    WDWN   female in NAD Body mass index is 53.21 kg/m.  General: Patient is well-groomed, well-nourished, appears stated age in no acute distress  HEENT: head is atraumatic and normocephalic, trachea is midline, neck is supple with no palpable nodules  CV: Regular rhythm and normal heart rate, no murmur  Pulm: Clear to auscultation throughout lung fields with no wheezing, crackles, or rhonchi. No increased work of breathing  Abdomen: soft , no mass, non-tender, no rebound tenderness, no hepatomegaly  Pelvic: Deferred  Impression:   The primary encounter diagnosis was Post-menopausal bleeding. Diagnoses of Increased endometrial stripe thickness and Cervical stenosis (uterine cervix) were also pertinent to this visit.    Plan:   -  Preoperative visit: D&C hysteroscopy, for above. Consents signed today. Risks of surgery were discussed with the patient including but not limited to: bleeding which may require transfusion; infection which may require antibiotics; injury to uterus or surrounding organs; intrauterine scarring, need for additional procedures including laparotomy or laparoscopy; and other postoperative/anesthesia complications. Written informed consent was obtained.  This is a scheduled same-day surgery. She will have a postop visit in 2 weeks to review operative findings and pathology.

## 2017-06-29 NOTE — Patient Instructions (Addendum)
Your procedure is scheduled on: Friday, Jul 06, 2017  Report to [facility].  DO NOT STOP ON FIRST FLOOR TO REGISTER  To find out your arrival time please call [phone] between 1PM - 3PM on Thursday, Jul 05, 2017  Remember: Instructions that are not followed completely may result in serious medical risk,  up to and including death, or upon the discretion of your surgeon and anesthesiologist your  surgery may need to be rescheduled.     _X__ 1. Do not eat food after midnight the night before your procedure.                 No gum chewing or hard candies.                   You may drink clear liquids up to 2 hours before you are scheduled to arrive for your surgery-                  DO not drink clear liquids within 2 hours of the start of your surgery.                  Clear Liquids include:  water, apple juice without pulp, clear carbohydrate                 drink such as Clearfast of [place], Black Coffee or Tea (Do not add                 anything to coffee or tea).  __X__2.  On the morning of surgery brush your teeth with toothpaste and water,                       You may rinse your mouth with mouthwash if you wish.                             Do not swallow any toothpaste of mouthwash.     _X__ 3.  No Alcohol for 24 hours before or after surgery.   _X__ 4.  Do Not Smoke or use e-cigarettes For 24 Hours Prior to Your Surgery.                 Do not use any chewable tobacco products for at least 6 hours prior to                 surgery.  ____  5.  Bring all medications with you on the day of surgery if instructed.   _X___  6.  Notify your doctor if there is any change in your medical condition      (cold, fever, infections).     Do not wear jewelry, make-up, hairpins, clips or nail polish. Do not wear lotions, powders, or perfumes. You may wear deodorant. Do not shave 48 hours prior to surgery. Men may shave face and  neck. Do not bring valuables to the hospital.    [facility] is not responsible for any belongings or valuables.  Contacts, dentures or bridgework may not be worn into surgery. Leave your suitcase in the car. After surgery it may be brought to your room. For patients admitted to the hospital, discharge time is determined by your treatment team.   Patients discharged the day of surgery will not be allowed to drive home.   Please read over the following fact sheets that you  were given:        PREPARING FOR SURGERY   ____ Take these medicines the morning of surgery with A SIP OF WATER:    1. CYMBALTA  2. METOPROLOL  3.  ALBUTEROL  4.  FLONASE  5.  EYE DROPS IF NEEDED  6.  ____ Fleet Enema (as directed)   ____ Use CHG Soap as directed  __X__ Use inhalers on the day of surgery  _X___ Stop BABY ASPIRIN AS OF TODAY  __X__ Stop Anti-inflammatories AS OF NOW.                THIS INCLUDES MELOXICAM / IBUPROFEN / ADVIL / ALEVE MOTRIN                          **YOU MAY TAKE TYLENOL AT ANY TIME**   _X__ Stop supplements until after surgery. STOP NOW               THIS INCLUDES TURMERIC  ____ Bring C-Pap to the hospital.   STOP VICTOZA NOW  CONTINUE TAKING HYZAAR (LOSARTAN/HCTZ)  BUT DO NOT TAKE ON THE DAY OF SURGERY  BRING YOUR BIPAP WITH YOU ON THE DAY OF SURGERY.  CARRY IT INTO SDS WHEN YOU ARRIVE.  PLEASE BRING A COPY OF YOUR MEDICAL DIRECTIVES SO WE MAY PUT A COPY IN YOUR CHART.  [place]

## 2017-07-03 NOTE — Pre-Procedure Instructions (Signed)
Patient called to ask if she could take cough medicine (Benzonatate and Benadryl) for her allergies. Told her that she could take these medicines prior to surgery.

## 2017-07-06 ENCOUNTER — Encounter: Payer: Self-pay | Admitting: *Deleted

## 2017-07-06 ENCOUNTER — Ambulatory Visit: Payer: Medicare Other | Admitting: Anesthesiology

## 2017-07-06 ENCOUNTER — Encounter
Admission: RE | Disposition: A | Discharge status: Home / Self Care | Payer: Self-pay | Source: Ambulatory Visit | Attending: Obstetrics and Gynecology

## 2017-07-06 ENCOUNTER — Other Ambulatory Visit: Payer: Self-pay

## 2017-07-06 ENCOUNTER — Ambulatory Visit
Admission: RE | Admit: 2017-07-06 | Discharge: 2017-07-06 | Disposition: A | Discharge status: Home / Self Care | Payer: Medicare Other | Source: Ambulatory Visit | Attending: Obstetrics and Gynecology | Admitting: Obstetrics and Gynecology

## 2017-07-06 DIAGNOSIS — I739 Peripheral vascular disease, unspecified: Secondary | ICD-10-CM | POA: Diagnosis not present

## 2017-07-06 DIAGNOSIS — Z6841 Body Mass Index (BMI) 40.0 and over, adult: Secondary | ICD-10-CM | POA: Diagnosis not present

## 2017-07-06 DIAGNOSIS — Z8719 Personal history of other diseases of the digestive system: Secondary | ICD-10-CM | POA: Insufficient documentation

## 2017-07-06 DIAGNOSIS — M1990 Unspecified osteoarthritis, unspecified site: Secondary | ICD-10-CM | POA: Diagnosis not present

## 2017-07-06 DIAGNOSIS — Z888 Allergy status to other drugs, medicaments and biological substances status: Secondary | ICD-10-CM | POA: Insufficient documentation

## 2017-07-06 DIAGNOSIS — N950 Postmenopausal bleeding: Secondary | ICD-10-CM | POA: Diagnosis present

## 2017-07-06 DIAGNOSIS — Z79899 Other long term (current) drug therapy: Secondary | ICD-10-CM | POA: Diagnosis not present

## 2017-07-06 DIAGNOSIS — N750 Cyst of Bartholin's gland: Secondary | ICD-10-CM | POA: Insufficient documentation

## 2017-07-06 DIAGNOSIS — Z7982 Long term (current) use of aspirin: Secondary | ICD-10-CM | POA: Diagnosis not present

## 2017-07-06 DIAGNOSIS — F329 Major depressive disorder, single episode, unspecified: Secondary | ICD-10-CM | POA: Diagnosis not present

## 2017-07-06 DIAGNOSIS — Z881 Allergy status to other antibiotic agents status: Secondary | ICD-10-CM | POA: Diagnosis not present

## 2017-07-06 DIAGNOSIS — Z882 Allergy status to sulfonamides status: Secondary | ICD-10-CM | POA: Diagnosis not present

## 2017-07-06 DIAGNOSIS — E785 Hyperlipidemia, unspecified: Secondary | ICD-10-CM | POA: Insufficient documentation

## 2017-07-06 DIAGNOSIS — Z86010 Personal history of colonic polyps: Secondary | ICD-10-CM | POA: Diagnosis not present

## 2017-07-06 DIAGNOSIS — Z9989 Dependence on other enabling machines and devices: Secondary | ICD-10-CM | POA: Diagnosis not present

## 2017-07-06 DIAGNOSIS — N8502 Endometrial intraepithelial neoplasia [EIN]: Secondary | ICD-10-CM | POA: Insufficient documentation

## 2017-07-06 DIAGNOSIS — I10 Essential (primary) hypertension: Secondary | ICD-10-CM | POA: Diagnosis not present

## 2017-07-06 DIAGNOSIS — G4733 Obstructive sleep apnea (adult) (pediatric): Secondary | ICD-10-CM | POA: Insufficient documentation

## 2017-07-06 DIAGNOSIS — R011 Cardiac murmur, unspecified: Secondary | ICD-10-CM | POA: Insufficient documentation

## 2017-07-06 HISTORY — PX: BARTHOLIN GLAND CYST EXCISION: SHX565

## 2017-07-06 HISTORY — PX: HYSTEROSCOPY WITH D & C: SHX1775

## 2017-07-06 LAB — ABO/RH: ABO/RH(D): O NEG

## 2017-07-06 LAB — GLUCOSE, CAPILLARY
GLUCOSE-CAPILLARY: 94 mg/dL (ref 65–99)
GLUCOSE-CAPILLARY: 122 mg/dL — AB (ref 65–99)

## 2017-07-06 SURGERY — DILATATION AND CURETTAGE /HYSTEROSCOPY
Anesthesia: General | Site: Vagina | Wound class: Clean Contaminated

## 2017-07-06 MED ORDER — FAMOTIDINE 20 MG PO TABS
20.0000 mg | ORAL_TABLET | Freq: Once | ORAL | Status: AC
  Administered 2017-07-06: 20 mg via ORAL

## 2017-07-06 MED ORDER — ONDANSETRON HCL 4 MG/2ML IJ SOLN
INTRAMUSCULAR | Status: AC
  Filled 2017-07-06: qty 2

## 2017-07-06 MED ORDER — FENTANYL CITRATE (PF) 100 MCG/2ML IJ SOLN: 25.0000 ug | INTRAMUSCULAR | Status: DC | PRN

## 2017-07-06 MED ORDER — LIDOCAINE HCL (CARDIAC) PF 100 MG/5ML IV SOSY
PREFILLED_SYRINGE | INTRAVENOUS | Status: DC | PRN
  Administered 2017-07-06: 100 mg via INTRAVENOUS

## 2017-07-06 MED ORDER — OXYCODONE HCL 5 MG PO TABS
ORAL_TABLET | ORAL | Status: AC
  Administered 2017-07-06: 5 mg via ORAL
  Filled 2017-07-06: qty 1

## 2017-07-06 MED ORDER — PHENYLEPHRINE HCL 10 MG/ML IJ SOLN
INTRAMUSCULAR | Status: DC | PRN
  Administered 2017-07-06: 100 ug via INTRAVENOUS

## 2017-07-06 MED ORDER — EPHEDRINE SULFATE 50 MG/ML IJ SOLN
INTRAMUSCULAR | Status: AC
  Filled 2017-07-06: qty 1

## 2017-07-06 MED ORDER — BUPIVACAINE-EPINEPHRINE (PF) 0.25% -1:200000 IJ SOLN
INTRAMUSCULAR | Status: AC
  Filled 2017-07-06: qty 30

## 2017-07-06 MED ORDER — ONDANSETRON HCL 4 MG/2ML IJ SOLN: 4.0000 mg | Freq: Once | INTRAMUSCULAR | Status: DC | PRN

## 2017-07-06 MED ORDER — SILVER NITRATE-POT NITRATE 75-25 % EX MISC
CUTANEOUS | Status: DC | PRN
  Administered 2017-07-06: 1

## 2017-07-06 MED ORDER — GLYCOPYRROLATE 0.2 MG/ML IJ SOLN
INTRAMUSCULAR | Status: DC | PRN
  Administered 2017-07-06: 0.2 mg via INTRAVENOUS

## 2017-07-06 MED ORDER — MIDAZOLAM HCL 2 MG/2ML IJ SOLN
INTRAMUSCULAR | Status: DC | PRN
  Administered 2017-07-06: 2 mg via INTRAVENOUS

## 2017-07-06 MED ORDER — OXYCODONE HCL 5 MG PO TABS
5.0000 mg | ORAL_TABLET | Freq: Once | ORAL | Status: AC
Start: 2017-07-06 — End: 2017-07-06
  Administered 2017-07-06: 5 mg via ORAL

## 2017-07-06 MED ORDER — PROPOFOL 10 MG/ML IV BOLUS
INTRAVENOUS | Status: AC
  Filled 2017-07-06: qty 20

## 2017-07-06 MED ORDER — FENTANYL CITRATE (PF) 100 MCG/2ML IJ SOLN
INTRAMUSCULAR | Status: AC
  Filled 2017-07-06: qty 2

## 2017-07-06 MED ORDER — LACTATED RINGERS IV SOLN
INTRAVENOUS | Status: DC
  Administered 2017-07-06: 17:00:00 via INTRAVENOUS

## 2017-07-06 MED ORDER — LIDOCAINE HCL (PF) 2 % IJ SOLN
INTRAMUSCULAR | Status: AC
  Filled 2017-07-06: qty 10

## 2017-07-06 MED ORDER — MIDAZOLAM HCL 2 MG/2ML IJ SOLN
INTRAMUSCULAR | Status: AC
  Filled 2017-07-06: qty 2

## 2017-07-06 MED ORDER — LACTATED RINGERS IV SOLN
INTRAVENOUS | Status: DC
  Administered 2017-07-06: 14:00:00 via INTRAVENOUS

## 2017-07-06 MED ORDER — PROPOFOL 10 MG/ML IV BOLUS
INTRAVENOUS | Status: DC | PRN
  Administered 2017-07-06: 20 mg via INTRAVENOUS
  Administered 2017-07-06: 180 mg via INTRAVENOUS

## 2017-07-06 MED ORDER — DEXAMETHASONE SODIUM PHOSPHATE 10 MG/ML IJ SOLN
INTRAMUSCULAR | Status: AC
  Filled 2017-07-06: qty 1

## 2017-07-06 MED ORDER — SUCCINYLCHOLINE CHLORIDE 20 MG/ML IJ SOLN
INTRAMUSCULAR | Status: DC | PRN
  Administered 2017-07-06: 200 mg via INTRAVENOUS

## 2017-07-06 MED ORDER — ONDANSETRON HCL 4 MG/2ML IJ SOLN
INTRAMUSCULAR | Status: DC | PRN
  Administered 2017-07-06: 4 mg via INTRAVENOUS

## 2017-07-06 MED ORDER — FENTANYL CITRATE (PF) 100 MCG/2ML IJ SOLN
INTRAMUSCULAR | Status: DC | PRN
  Administered 2017-07-06: 25 ug via INTRAVENOUS
  Administered 2017-07-06: 50 ug via INTRAVENOUS
  Administered 2017-07-06: 25 ug via INTRAVENOUS

## 2017-07-06 MED ORDER — FAMOTIDINE 20 MG PO TABS
ORAL_TABLET | ORAL | Status: AC
  Filled 2017-07-06: qty 1

## 2017-07-06 MED ORDER — DEXAMETHASONE SODIUM PHOSPHATE 10 MG/ML IJ SOLN
INTRAMUSCULAR | Status: DC | PRN
  Administered 2017-07-06: 10 mg via INTRAVENOUS

## 2017-07-06 MED ORDER — GLYCOPYRROLATE 0.2 MG/ML IJ SOLN
INTRAMUSCULAR | Status: AC
  Filled 2017-07-06: qty 1

## 2017-07-06 SURGICAL SUPPLY — 19 items
BAG INFUSER PRESSURE 100CC (MISCELLANEOUS) ×4 IMPLANT
CANISTER SUCT 3000ML PPV (MISCELLANEOUS) ×4 IMPLANT
CATH ROBINSON RED A/P 16FR (CATHETERS) ×4 IMPLANT
ELECT REM PT RETURN 9FT ADLT (ELECTROSURGICAL) ×4
ELECTRODE REM PT RTRN 9FT ADLT (ELECTROSURGICAL) ×2 IMPLANT
GLOVE BIO SURGEON STRL SZ7 (GLOVE) ×4 IMPLANT
GLOVE INDICATOR 7.5 STRL GRN (GLOVE) ×4 IMPLANT
GOWN STRL REUS W/ TWL LRG LVL3 (GOWN DISPOSABLE) ×4 IMPLANT
GOWN STRL REUS W/TWL LRG LVL3 (GOWN DISPOSABLE) ×4
IV LACTATED RINGERS 1000ML (IV SOLUTION) ×4 IMPLANT
KIT TURNOVER CYSTO (KITS) ×4 IMPLANT
MYOSURE LITE POLYP REMOVAL (MISCELLANEOUS) ×4 IMPLANT
PACK DNC HYST (MISCELLANEOUS) ×4 IMPLANT
PAD OB MATERNITY 4.3X12.25 (PERSONAL CARE ITEMS) ×4 IMPLANT
PAD PREP 24X41 OB/GYN DISP (PERSONAL CARE ITEMS) ×4 IMPLANT
SUT VIC AB 2-0 CT1 (SUTURE) ×4 IMPLANT
TUBING CONNECTING 10 (TUBING) ×3 IMPLANT
TUBING CONNECTING 10' (TUBING) ×1
TUBING HYSTEROSCOPY DOLPHIN (MISCELLANEOUS) ×4 IMPLANT

## 2017-07-06 NOTE — Anesthesia Postprocedure Evaluation (Signed)
Anesthesia Post Note  Patient: Dawn Oliver  Procedure(s) Performed: DILATATION AND CURETTAGE /HYSTEROSCOPY (N/A Vagina ) BARTHOLIN GLAND EXCISION (Vagina )  Patient location during evaluation: PACU Anesthesia Type: General Level of consciousness: awake and alert Pain management: pain level controlled Vital Signs Assessment: post-procedure vital signs reviewed and stable Respiratory status: spontaneous breathing, nonlabored ventilation, respiratory function stable and patient connected to nasal cannula oxygen Cardiovascular status: blood pressure returned to baseline and stable Postop Assessment: no apparent nausea or vomiting Anesthetic complications: no     Last Vitals:  Vitals:   07/06/17 1836 07/06/17 1851  BP: 123/60 (!) 121/50  Pulse: 78 73  Resp: 19 14  Temp:  36.7 C  SpO2: 98% 100%    Last Pain:  Vitals:   07/06/17 1911  TempSrc:   PainSc: 4                  Precious Haws Mcclellan Demarais

## 2017-07-06 NOTE — Anesthesia Post-op Follow-up Note (Signed)
Anesthesia QCDR form completed.        

## 2017-07-06 NOTE — Anesthesia Procedure Notes (Signed)
Procedure Name: Intubation Date/Time: 07/06/2017 4:51 PM Performed by: Doreen Salvage, CRNA Pre-anesthesia Checklist: Patient identified, Patient being monitored, Timeout performed, Emergency Drugs available and Suction available Patient Re-evaluated:Patient Re-evaluated prior to induction Oxygen Delivery Method: Circle system utilized Preoxygenation: Pre-oxygenation with 100% oxygen Induction Type: IV induction Ventilation: Mask ventilation without difficulty Laryngoscope Size: Mac, McGraph and 4 Grade View: Grade I Tube type: Oral Tube size: 7.0 mm Number of attempts: 1 Airway Equipment and Method: Stylet Placement Confirmation: ETT inserted through vocal cords under direct vision,  positive ETCO2 and breath sounds checked- equal and bilateral Secured at: 21 cm Tube secured with: Tape Dental Injury: Teeth and Oropharynx as per pre-operative assessment

## 2017-07-06 NOTE — Interval H&P Note (Signed)
History and Physical Interval Note:  07/06/2017 4:30 PM  Dawn Oliver  has presented today for surgery, with the diagnosis of postmenopausal bleeding, cervical stenosis, thickened endometrial stripe  The various methods of treatment have been discussed with the patient and family. After consideration of risks, benefits and other options for treatment, the patient has consented to  Procedure(s): DILATATION AND CURETTAGE /HYSTEROSCOPY (N/A) as a surgical intervention .  The patient's history has been reviewed, patient examined, no change in status, stable for surgery.  I have reviewed the patient's chart and labs.  Questions were answered to the patient's satisfaction.     Benjaman Kindler

## 2017-07-06 NOTE — Op Note (Addendum)
Operative Report Hysteroscopy with Dilation and Curettage   Indications: Postmenopausal bleeding   Pre-operative Diagnosis: Thickened endometrial stripe, cervical stenosis  Post-operative Diagnosis: Endometrial polyp Right Bartholin's cyst, the size of an egg  Procedure: 1. Exam under anesthesia 2. Fractional D&C 3. Hysteroscopy 4. Myosure polypectomy 5. Drainage of right Bartholin's cyst  Surgeon: Benjaman Kindler, MD  Assistant(s):  None  Anesthesia: General LMA anesthesia  Anesthesiologist: Piscitello, Precious Haws, MD Anesthesiologist: Piscitello, Precious Haws, MD CRNA: Doreen Salvage, CRNA  Estimated Blood Loss:  Minimal - 76ml         Intraoperative medications: none         Total IV Fluids: 700 ml  Urine Output: 33ml  Total Fluid Deficit:  20 mL          Specimens: Endocervical curettings, endometrial curettings, biopsy of right Bartholin's cyst wall         Complications:  None; patient tolerated the procedure well.  (Just after removing the hysteroscopy, the power in the OR flickered out and promptly was replaced. The patient's vital signs were not affected.)         Disposition: PACU - hemodynamically stable.         Condition: stable  Findings: Uterus measuring 8 cm by sound; normal cervix, vagina, perineum. Endometrial polyp at right fundus. Large right bartholins cyst that drained bright red blood briskly on stab incision.  Indication for procedure/Consents: 72 y.o.  here for scheduled surgery for the aforementioned diagnoses.   Risks of surgery were discussed with the patient including but not limited to: bleeding which may require transfusion; infection which may require antibiotics; injury to uterus or surrounding organs; intrauterine scarring which may impair future fertility; need for additional procedures including laparotomy or laparoscopy; and other postoperative/anesthesia complications. Written informed consent was obtained.    Procedure Details:  D&C/  Myosure  The patient was taken to the operating room where anesthesia was administered and was found to be adequate. After a formal and adequate timeout was performed, she was placed in the dorsal lithotomy position and examined with the above findings. She was then prepped and draped in the sterile manner. Her bladder was catheterized for an estimated amount of clear, yellow urine. A weighed speculum was then placed in the patient's vagina and a single tooth tenaculum was applied to the anterior lip of the cervix.  Her cervix was serially dilated to 15 [place] using Hanks dilators. An ECC was performed. The hysteroscope was introduced under direct observation  Using lactated ringers as a distention medium to reveal the above findings. The uterine cavity was carefully examined, both ostia were recognized, and diffusely proliferative endometrium with the fibroid ( or polyp) above were noted.   This was resected using the Myosure device.  After further careful visualization of the uterine cavity, the hysteroscope was removed under direct visualization.  A sharp curettage was then performed until there was a gritty texture in all four quadrants. The tenaculum was removed from the anterior lip of the cervix and the vaginal speculum was removed after applying silver nitrate for good hemostasis.   The right Bartholin's cyst was palpated and a stab incision with an 11-blade released bright red blood and actively drained clear fluid with significant bleeding. Pressure did not help after 2 minutes, silver nitrate was not effective, and 3 figure of 8 stiches were required to stop bleeding at the incision site. At the close of the procedure, hemostasis was assured.  The patient tolerated the procedure well  and was taken to the recovery area awake and in stable condition. She received iv acetaminophen and Toradol prior to leaving the OR.  The patient will be discharged to home as per PACU criteria. Routine  postoperative instructions given. She was prescribed Ibuprofen and Colace. She will follow up in the clinic in two weeks for postoperative evaluation.

## 2017-07-06 NOTE — Transfer of Care (Signed)
Immediate Anesthesia Transfer of Care Note  Patient: Dawn Oliver  Procedure(s) Performed: Procedure(s): DILATATION AND CURETTAGE /HYSTEROSCOPY (N/A) BARTHOLIN GLAND EXCISION  Patient Location: PACU  Anesthesia Type:General  Level of Consciousness: sedated  Airway & Oxygen Therapy: Patient Spontanous Breathing and Patient connected to face mask oxygen  Post-op Assessment: Report given to RN and Post -op Vital signs reviewed and stable  Post vital signs: Reviewed and stable  Last Vitals:  Vitals:   07/06/17 1354 07/06/17 1806  BP: (!) 154/55 (!) 147/69  Pulse: 67 87  Resp: 16 18  Temp: (!) 36.3 C 36.6 C  SpO2: 42% 395%    Complications: No apparent anesthesia complications

## 2017-07-06 NOTE — Anesthesia Preprocedure Evaluation (Addendum)
Anesthesia Evaluation  Patient identified by MRN, date of birth, ID band Patient awake    Reviewed: Allergy & Precautions, NPO status , Patient's Chart, lab work & pertinent test results  History of Anesthesia Complications Negative for: history of anesthetic complications  Airway Mallampati: III       Dental  (+) Poor Dentition, Chipped, Missing   Pulmonary sleep apnea and Continuous Positive Airway Pressure Ventilation , neg COPD,           Cardiovascular hypertension, Pt. on medications + Peripheral Vascular Disease  (-) Past MI and (-) CHF (-) dysrhythmias + Valvular Problems/Murmurs (murmur, no tx)      Neuro/Psych neg Seizures Depression    GI/Hepatic Neg liver ROS, neg GERD  ,  Endo/Other  neg diabetes  Renal/GU negative Renal ROS     Musculoskeletal  (+) Arthritis ,   Abdominal   Peds  Hematology   Anesthesia Other Findings Past Medical History: No date: Achilles tendon disorder, right No date: Arthritis No date: Depression No date: Dyspnea     Comment:  with excercise No date: Heart murmur     Comment:  slight, no treatment No date: Hyperlipidemia No date: Hypertension No date: Lymphadenopathy     Comment:  right lower extremity, wears support hose No date: Morbid obesity ([place]) No date: Plantar fasciitis of right foot 06/2017: Postmenopausal bleeding No date: Sleep apnea     Comment:  uses bipap   Reproductive/Obstetrics                            Anesthesia Physical Anesthesia Plan  ASA: III  Anesthesia Plan: General   Post-op Pain Management:    Induction:   PONV Risk Score and Plan: 3 and Dexamethasone, Ondansetron and Midazolam  Airway Management Planned: Oral ETT  Additional Equipment:   Intra-op Plan:   Post-operative Plan:   Informed Consent: I have reviewed the patients History and Physical, chart, labs and discussed the procedure including the  risks, benefits and alternatives for the proposed anesthesia with the patient or authorized representative who has indicated his/her understanding and acceptance.     Plan Discussed with:   Anesthesia Plan Comments:         Anesthesia Quick Evaluation

## 2017-07-06 NOTE — Discharge Instructions (Signed)
Discharge instructions after a hysteroscopy with dilation and curettage  Signs and Symptoms to Report  Call our office at [phone] if you have any of the following:    Fever over 100.4 degrees or higher  Severe stomach pain not relieved with pain medications  Bright red bleeding thats heavier than a period that does not slow with rest after the first 24 hours  To go the bathroom a lot (frequency), you cant hold your urine (urgency), or it hurts when you empty your bladder (urinate)  Chest pain  Shortness of breath  Pain in the calves of your legs  Severe nausea and vomiting not relieved with anti-nausea medications  Any concerns  What You Can Expect after Surgery  You may see some pink tinged, bloody fluid. This is normal. You may also have cramping for several days.   Activities after Your Discharge Follow these guidelines to help speed your recovery at home:  Dont drive if you are in pain or taking narcotic pain medicine. You may drive when you can safely slam on the brakes, turn the wheel forcefully, and rotate your torso comfortably. This is typically 4-7 days. Practice in a parking lot or side street prior to attempting to drive regularly.   Ask others to help with household chores for 4 weeks.  Dont do strenuous activities, exercises, or sports like vacuuming, tennis, squash, etc. until your doctor says it is safe to do so.  Walk as you feel able. Rest often since it may take a week or two for your energy level to return to normal.   You may climb stairs  Avoid constipation:   -Eat fruits, vegetables, and whole grains. Eat small meals as your appetite will take time to return to normal.   -Drink 6 to 8 glasses of water each day unless your doctor has told you to limit your fluids.   -Use a laxative or stool softener as needed if constipation becomes a problem. You may take Miralax, metamucil, Citrucil, Colace, Senekot, FiberCon, etc. If this does not  relieve the constipation, try two tablespoons of Milk Of Magnesia every 8 hours until your bowels move.   You may shower.   Do not get in a hot tub, swimming pool, etc. until your doctor agrees.  Do not douche, use tampons, or have sex until your doctor says it is okay, usually about 2 weeks.  Take your pain medicine when you need it. The medicine may not work as well if the pain is bad.  Take the medicines you were taking before surgery. Other medications you might need are pain medications (ibuprofen), medications for constipation (Colace) and nausea medications (Zofran).    I recommend sitz baths and gentle soap twice a day for 3 days, with patting dry.   AMBULATORY SURGERY  DISCHARGE INSTRUCTIONS   1) The drugs that you were given will stay in your system until tomorrow so for the next 24 hours you should not:  A) Drive an automobile B) Make any legal decisions C) Drink any alcoholic beverage   2) You may resume regular meals tomorrow.  Today it is better to start with liquids and gradually work up to solid foods.  You may eat anything you prefer, but it is better to start with liquids, then soup and crackers, and gradually work up to solid foods.   3) Please notify your doctor immediately if you have any unusual bleeding, trouble breathing, redness and pain at the surgery site, drainage, fever,  or pain not relieved by medication.    4) Additional Instructions:    Please contact your physician with any problems or Same Day Surgery at [phone], Monday through Friday 6 am to 4 pm, or [place] at [facility] number at [phone].

## 2017-07-07 ENCOUNTER — Encounter: Payer: Self-pay | Admitting: Obstetrics and Gynecology

## 2017-07-10 LAB — SURGICAL PATHOLOGY

## 2017-08-15 ENCOUNTER — Inpatient Hospital Stay: Payer: Medicare Other | Attending: Obstetrics and Gynecology | Admitting: Obstetrics and Gynecology

## 2017-08-15 ENCOUNTER — Encounter (INDEPENDENT_AMBULATORY_CARE_PROVIDER_SITE_OTHER): Payer: Self-pay

## 2017-08-15 VITALS — BP 120/69 | HR 77 | Temp 98.4°F | Resp 18 | Ht 64.0 in | Wt 317.2 lb

## 2017-08-15 DIAGNOSIS — N8502 Endometrial intraepithelial neoplasia [EIN]: Secondary | ICD-10-CM | POA: Insufficient documentation

## 2017-08-15 DIAGNOSIS — N750 Cyst of Bartholin's gland: Secondary | ICD-10-CM | POA: Diagnosis not present

## 2017-08-15 DIAGNOSIS — G4733 Obstructive sleep apnea (adult) (pediatric): Secondary | ICD-10-CM

## 2017-08-15 DIAGNOSIS — H02839 Dermatochalasis of unspecified eye, unspecified eyelid: Secondary | ICD-10-CM

## 2017-08-15 DIAGNOSIS — H02889 Meibomian gland dysfunction of unspecified eye, unspecified eyelid: Secondary | ICD-10-CM

## 2017-08-15 DIAGNOSIS — J309 Allergic rhinitis, unspecified: Secondary | ICD-10-CM | POA: Insufficient documentation

## 2017-08-15 HISTORY — DX: Allergic rhinitis, unspecified: J30.9

## 2017-08-15 HISTORY — DX: Dermatochalasis of unspecified eye, unspecified eyelid: H02.839

## 2017-08-15 HISTORY — DX: Obstructive sleep apnea (adult) (pediatric): G47.33

## 2017-08-15 HISTORY — DX: Meibomian gland dysfunction of unspecified eye, unspecified eyelid: H02.889

## 2017-08-15 NOTE — Progress Notes (Signed)
No Gyn changes today , No bleeding, no discharge, no pain with urination.

## 2017-08-15 NOTE — Progress Notes (Signed)
Gynecologic Oncology Consult Visit   Referring Provider: Dr. Leafy Ro  Chief Complaint: EIN and vulvar Bartholin's cyst.  Subjective:  Dawn Oliver is a 72 y.o. female who is seen in consultation from Dr. Leafy Ro for EIN and Bartholin's cyst.   Patient presented to Dr. Leafy Ro for evaluation of PMB 5/19. Previously, she had had PMB in 2008 and had D&C and polypectomy (cervical stenosis, precluded office EMBx) with Dr. Enzo Bi. Per record review, pathology was benign polyp in 2008.   Ultrasound revealed:  Uterus: 8x4x5cm, anteverted No ovaries visualized Endometrial Stripe 9.64mm  07/06/17- D&C & Hysteroscopy and drainage of right Bartholin's cyst. Uterus measured 8 cm by sound; normal cervix, vagina, perineum. Endometrial polyp at right fundus resected with Myosure. Large right bartholins cyst that drained bright red blood briskly on stab incision.  Pathology:  DIAGNOSIS: A.ENDOCERVICAL CURETTINGS; DILATATION AND CURETTAGE: - BENIGN CERVICAL TISSUE.  B.ENDOMETRIAL CURETTINGS; DILATATION AND CURETTAGE: - BENIGN ENDOMETRIAL TISSUE FRAGMENTS WITH TUBAL METAPLASIA. - NEGATIVE FOR ATYPIA AND MALIGNANCY.  C.BARTHOLIN CYST, RIGHT; BIOPSY: - UNLINED CYST WALL WITH UNDERLYING VASCULAR CONGESTION AND FIBROSIS. - NEGATIVE FOR ATYPIA AND MALIGNANCY.  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  - SQUAMOUS METAPLASIA.  She was seen post-operatively by Dr. Leafy Ro and noted to have continued bleeding from Bartholin's cyst but otherwise recovering well from [facility].    Today, patient reports hot flashes with sweats. Vaginal bleeding has stopped.    Problem List: Patient Active Problem List   Diagnosis Date Noted  . Meibomian gland dysfunction 08/15/2017  . Dermatochalasis 08/15/2017  . OSA (obstructive sleep apnea) 08/15/2017  . Allergic rhinitis 08/15/2017  . Chronic venous  insufficiency 11/11/2015  . Lymphedema 11/11/2015  . Bilateral leg pain 11/11/2015    Past Medical History: Past Medical History:  Diagnosis Date  . Achilles tendon disorder, right   . Allergic rhinitis 08/15/2017  . Arthritis   . Depression   . Dermatochalasis 08/15/2017  . Dyspnea    with excercise  . Heart murmur    slight, no treatment  . Hyperlipidemia   . Hypertension   . Lymphadenopathy    right lower extremity, wears support hose  . Meibomian gland dysfunction 08/15/2017  . Morbid obesity ([place])   . OSA (obstructive sleep apnea) 08/15/2017  . Plantar fasciitis of right foot   . Postmenopausal bleeding 06/2017  . Sleep apnea    uses bipap    Past Surgical History: Past Surgical History:  Procedure Laterality Date  . BARTHOLIN GLAND CYST EXCISION  07/06/2017   Procedure: BARTHOLIN GLAND EXCISION;  Surgeon: Benjaman Kindler, MD;  Location: [facility] ORS;  Service: Gynecology;;  . BLEPHAROPLASTY Bilateral 2015  . CARPAL TUNNEL RELEASE Bilateral 2012  . CATARACT EXTRACTION    . CHOLECYSTECTOMY  1997  . COLECTOMY  2001  . COLON SURGERY     colonoscopy  . COLONOSCOPY N/A 06/11/2014   Procedure: COLONOSCOPY;  Surgeon: Hulen Luster, MD;  Location: [facility] ENDOSCOPY;  Service: Gastroenterology;  Laterality: N/A;  . EYE SURGERY Bilateral 2008   cataract extractions with lens implants  . HYSTEROSCOPY  2008  . HYSTEROSCOPY W/D&C N/A 07/06/2017   Procedure: DILATATION AND CURETTAGE /HYSTEROSCOPY;  Surgeon: Benjaman Kindler, MD;  Location: [facility] ORS;  Service: Gynecology;  Laterality: N/A;  . SHOULDER OPEN ROTATOR CUFF REPAIR Right 2015    Past Gynecologic History:  G2P2, vaginal deliveries.  Menarche: age 32 Denies OCP/HRT Denies history of abnormal paps Denies history of STDs Sexually active  OB History:  OB History  No data available    Family History: Family History  Problem Relation Age of Onset  . Breast cancer Maternal Aunt 80  . Diabetes Mother   . Hypertension  Mother   . Peripheral Artery Disease Mother   . Stroke Mother   . Varicose Veins Mother   . Congestive Heart Failure Father   . Heart attack Father     Social History: Social History   Socioeconomic History  . Marital status: Married    Spouse name: Not on file  . Number of children: Not on file  . Years of education: Not on file  . Highest education level: Not on file  Occupational History  . Not on file  Social Needs  . Financial resource strain: Not on file  . Food insecurity:    Worry: Not on file    Inability: Not on file  . Transportation needs:    Medical: Not on file    Non-medical: Not on file  Tobacco Use  . Smoking status: Never Smoker  . Smokeless tobacco: Never Used  Substance and Sexual Activity  . Alcohol use: No  . Drug use: No  . Sexual activity: Yes  Lifestyle  . Physical activity:    Days per week: Not on file    Minutes per session: Not on file  . Stress: Not on file  Relationships  . Social connections:    Talks on phone: Not on file    Gets together: Not on file    Attends religious service: Not on file    Active member of club or organization: Not on file    Attends meetings of clubs or organizations: Not on file    Relationship status: Not on file  . Intimate partner violence:    Fear of current or ex partner: Not on file    Emotionally abused: Not on file    Physically abused: Not on file    Forced sexual activity: Not on file  Other Topics Concern  . Not on file  Social History Narrative  . Not on file    Allergies: Allergies  Allergen Reactions  . Ezetimibe Other (See Comments)     Muscle Pain with all statins   . Lisinopril Cough    Cough   . Norethin-Eth Estrad Biphasic Other (See Comments)    Postmenopausal bleeding. Patient unaware of this  . Statins Other (See Comments)    Muscle pain with diagnosed muscle damage  . Doxycycline Rash  . Erythromycin Other (See Comments)    Caused crazy dreams  . Seasonal Ic  [Cholestatin] Other (See Comments)    Runny nose, itchy eyes  . Sulfa Antibiotics Other (See Comments)    Unknown reaction. Patient unaware of when or what happened     Current Medications: Current Outpatient Medications  Medication Sig Dispense Refill  . aspirin EC 81 MG tablet Take 81 mg by mouth daily.     . DULoxetine (CYMBALTA) 60 MG capsule Take 60 mg by mouth daily.    Marland Kitchen losartan-hydrochlorothiazide (HYZAAR) 100-25 MG per tablet Take 1 tablet by mouth daily.     . meloxicam (MOBIC) 15 MG tablet Take 15 mg by mouth daily.    . metoprolol succinate (TOPROL-XL) 50 MG 24 hr tablet Take 50 mg by mouth daily. In the morning    . Polyethyl Glycol-Propyl Glycol (SYSTANE OP) Place 1 drop into both eyes daily as needed (dry eyes).    . Turmeric  500 MG TABS Take 500 mg by mouth daily.    Marland Kitchen acetaminophen (TYLENOL) 500 MG tablet Take 1,000-1,500 mg by mouth 2 (two) times daily as needed for moderate pain or headache.    . albuterol (PROVENTIL HFA;VENTOLIN HFA) 108 (90 Base) MCG/ACT inhaler Inhale 2 puffs into the lungs every 6 (six) hours as needed for wheezing or shortness of breath.     . benzonatate (TESSALON) 100 MG capsule Take 100 mg by mouth 3 (three) times daily as needed for cough (for allergies).    . diphenhydrAMINE (BENADRYL) 25 MG tablet Take 25 mg by mouth every 6 (six) hours as needed for allergies.    Marland Kitchen doxycycline (VIBRAMYCIN) 100 MG capsule Take 1 capsule (100 mg total) by mouth 2 (two) times daily. (Patient not taking: Reported on 08/15/2017) 14 capsule 0  . fluticasone (FLONASE) 50 MCG/ACT nasal spray Place 2 sprays into both nostrils daily.    Marland Kitchen liraglutide (VICTOZA) 18 MG/3ML SOPN Inject 1.2 mg into the skin daily. For weight loss     No current facility-administered medications for this visit.     Review of Systems General: negative for fevers, chills, fatigue, changes in sleep, changes in weight or appetite Skin: negative for changes in color, texture, moles or  lesions Eyes: negative for changes in vision, pain, diplopia HEENT: negative for change in hearing, pain, discharge, tinnitus, vertigo, voice changes, sore throat, neck masses Breasts: negative for breast lumps Pulmonary: negative for dyspnea, orthopnea, productive cough Cardiac: negative for palpitations, syncope, pain, discomfort, pressure Gastrointestinal: negative for dysphagia, nausea, vomiting, jaundice, pain, constipation, diarrhea, hematemesis, hematochezia Genitourinary/Sexual: negative for dysuria, discharge, hesitancy, nocturia, retention, stones, infections, STD's, incontinence Ob/Gyn: negative for irregular bleeding, pain Musculoskeletal: OA of r and l knee. Foot pain Hematology: negative for easy bruising, bleeding Neurologic/Psych: negative for headaches, seizures, paralysis, weakness, tremor, change in gait, change in sensation, mood swings, depression, anxiety, change in memory   Objective:  Physical Examination:  BP 120/69 (BP Location: Right Arm, Patient Position: Sitting)   Pulse 77   Temp 98.4 F (36.9 C) (Tympanic)   Resp 18   Ht 5\' 4"  (1.626 m)   Wt (!) 317 lb 3.2 oz (143.9 kg)   SpO2 97%   BMI 54.45 kg/m     ECOG Performance Status: 0 - Asymptomatic  GENERAL: Patient is a well appearing female in no acute distress HEENT:  Sclerae anicteric.  Oropharynx clear and moist. No ulcerations or evidence of oropharyngeal candidiasis. Neck is supple.  NODES:  No cervical, supraclavicular, or axillary lymphadenopathy palpated.  LUNGS:  Clear to auscultation bilaterally.  No wheezes or rhonchi. HEART:  Regular rate and rhythm. Heart murmur auscultated, grade 1 heard loudest at aortic ABDOMEN:  Morbid obesity. Prior surgical scar, upper abdomen. Soft, nontender.  Positive, normoactive bowel sounds. No organomegaly palpated. MSK:  No focal spinal tenderness to palpation. Full range of motion bilaterally in the upper extremities. EXTREMITIES:  No peripheral edema.    SKIN:  Clear with no obvious rashes or skin changes. No nail dyscrasia. NEURO:  Nonfocal. Well oriented.  Appropriate affect. BREAST: Breasts appear normal, no suspicious masses, no skin or nipple changes or axillary nodes  Pelvic: Exam Chaperoned by NP EGBUS: no lesions seen.  In the area of the right Bartholin's gland there is a 1-2 cm thickening, no mass. Left side not palpable. Cervix: no lesions, nontender, mobile Vagina: no lesions, no discharge or bleeding Uterus: normal size, nontender, mobile: exam limited by size.  Adnexa: no  palpable masses Rectovaginal: confirmatory    Assessment:  Dawn Oliver is a 72 y.o. female diagnosed with post menopausal bleeding and endometrial biopsy shows EIN in polyp.  No bleeding now.   Medical co-morbidities complicating care: Morbid obesity BMI 54.  Plan:   Problem List Items Addressed This Visit    None    Visit Diagnoses    EIN (endometrial intraepithelial neoplasia)    -  Primary   Bartholin's cyst         We discussed options for management including Mirena IUD for EIN since she is postmenopausal.  Since EIN was in a polyp and she is morbidly obese and at risk for complications with surgery, I recommend a Mirea IUD as the first approach.  Dr. Leafy Ro could place this in her office.  If there is more bleeding an endometrial biopsy can be done with the IUD in place and if recurrence or EIN or development of cancer she could have hysterectomy.    The right Bartholin's cyst was drained and the gland is now shrunken down to barely be palpable.  Biopsy of the cyst wall was benign.  In view of this, risk of cancer is very low, so would continue to monitor this with exams.  If glad enlarges again would do a complete excision.  The patient's diagnosis, an outline of the further diagnostic and laboratory studies which will be required, the recommendation for surgery, and alternatives were discussed with her and her accompanying family members.   All questions were answered to their satisfaction.  A total of 40 minutes were spent with the patient/family today; 50% was spent in education, counseling and coordination of care for EIN and Bartholin's cyst.     Beckey Rutter, DNP, AGNP-C [place] [facility] [phone] (work cell) [phone] (office)  I personally interviewed and examined the patient. Agreed with the above/below plan of care. Patient/family questions were answered.  Mellody Drown, MD   CC:  [name], MD [facility] [place], [place] [address] [phone]

## 2017-12-11 ENCOUNTER — Telehealth: Payer: Self-pay

## 2017-12-11 NOTE — Telephone Encounter (Signed)
Dr. Migdalia Dk notes reviewed. We will see her back in [place] clinic 11/27.

## 2018-01-02 ENCOUNTER — Inpatient Hospital Stay: Payer: Medicare Other | Attending: Obstetrics and Gynecology | Admitting: Obstetrics and Gynecology

## 2018-01-02 ENCOUNTER — Inpatient Hospital Stay: Payer: Medicare Other

## 2018-01-02 ENCOUNTER — Encounter: Payer: Self-pay | Admitting: Obstetrics and Gynecology

## 2018-01-02 ENCOUNTER — Encounter (INDEPENDENT_AMBULATORY_CARE_PROVIDER_SITE_OTHER): Payer: Self-pay

## 2018-01-02 DIAGNOSIS — N750 Cyst of Bartholin's gland: Secondary | ICD-10-CM | POA: Diagnosis not present

## 2018-01-02 DIAGNOSIS — N8502 Endometrial intraepithelial neoplasia [EIN]: Secondary | ICD-10-CM | POA: Diagnosis not present

## 2018-01-02 NOTE — Patient Instructions (Addendum)

## 2018-01-02 NOTE — Progress Notes (Signed)
Pt has had bleeding every day except for rlas 2 days it has not been any

## 2018-01-02 NOTE — Progress Notes (Signed)
Gynecologic Oncology Consult Visit   Referring Provider: Dr. Benjaman Kindler  Chief Complaint: EIN and vulvar Bartholin's cyst with morbid obesity.  Subjective:  Dawn Oliver is a 72 y.o. female who is seen in consultation from Dr. Leafy Ro for EIN and Bartholin's cyst.   Patient had Liletta Progestin IUD placed 10/02/17 by Dr. Leafy Ro in view of EIN in a polyp and morbid obesity.  She continues to have light bleeding most days, although none the past 4 days. No other complaints. Does not think Bartholin's cyst has recurred.    History Patient presented to Dr. Leafy Ro for evaluation of PMB 5/19. Previously, she had had PMB in 2008 and had D&C and polypectomy (cervical stenosis, precluded office EMBx) with Dr. Enzo Bi. Per record review, pathology was benign polyp in 2008. Ultrasound revealed: Uterus: 8x4x5cm, anteverted, No ovaries visualized, Endometrial Stripe 9.56mm  07/06/17- D&C & Hysteroscopy and drainage/biopsy of right Bartholin's cyst. Uterus measured 8 cm by sound; normal cervix, vagina, perineum. Endometrial polyp at right fundus resected with Myosure. Large right bartholins cyst that drained bright red blood briskly on stab incision.  Pathology:  DIAGNOSIS: A.ENDOCERVICAL CURETTINGS; DILATATION AND CURETTAGE: - BENIGN CERVICAL TISSUE.  B.ENDOMETRIAL CURETTINGS; DILATATION AND CURETTAGE: - BENIGN ENDOMETRIAL TISSUE FRAGMENTS WITH TUBAL METAPLASIA. - NEGATIVE FOR ATYPIA AND MALIGNANCY.  C.BARTHOLIN CYST, RIGHT; BIOPSY: - UNLINED CYST WALL WITH UNDERLYING VASCULAR CONGESTION AND FIBROSIS. - NEGATIVE FOR ATYPIA AND MALIGNANCY.  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  - SQUAMOUS METAPLASIA.   Liletta Progestin IUD placed 10/02/17 by Dr. 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 Meibomian gland dysfunction 08/15/2017  . Dermatochalasis 08/15/2017  . OSA (obstructive sleep apnea) 08/15/2017  . Allergic rhinitis 08/15/2017  . Chronic venous insufficiency 11/11/2015  . Lymphedema 11/11/2015  . Bilateral leg pain 11/11/2015    Past Medical History: Past Medical History:  Diagnosis Date  . Achilles tendon disorder, right   . Allergic rhinitis 08/15/2017  . Arthritis   . Depression   . Dermatochalasis 08/15/2017  . Dyspnea    with excercise  . Heart murmur    slight, no treatment  . Hyperlipidemia   . Hypertension   . Lymphadenopathy    right lower extremity, wears support hose  . Meibomian gland dysfunction 08/15/2017  . Morbid obesity ([place])   . OSA (obstructive sleep apnea) 08/15/2017  . Plantar fasciitis of right foot   . Postmenopausal bleeding 06/2017  . Sleep apnea    uses bipap    Past Surgical History: Past Surgical History:  Procedure Laterality Date  . BARTHOLIN GLAND CYST EXCISION  07/06/2017   Procedure: BARTHOLIN GLAND EXCISION;  Surgeon: Benjaman Kindler, MD;  Location: [facility] ORS;  Service: Gynecology;;  . BLEPHAROPLASTY Bilateral 2015  . CARPAL TUNNEL RELEASE Bilateral 2012  . CATARACT EXTRACTION    . CHOLECYSTECTOMY  1997  . COLECTOMY  2001  . COLON SURGERY     colonoscopy  . COLONOSCOPY N/A 06/11/2014   Procedure: COLONOSCOPY;  Surgeon: Hulen Luster, MD;  Location: [facility] ENDOSCOPY;  Service: Gastroenterology;  Laterality: N/A;  . EYE SURGERY Bilateral 2008   cataract extractions with lens implants  . HYSTEROSCOPY  2008  . HYSTEROSCOPY W/D&C N/A 07/06/2017   Procedure: DILATATION AND CURETTAGE /HYSTEROSCOPY;  Surgeon: Benjaman Kindler, MD;  Location: [facility] ORS;  Service: Gynecology;  Laterality: N/A;  . SHOULDER OPEN ROTATOR CUFF REPAIR Right 2015    Past Gynecologic History:  G2P2, vaginal deliveries.  Menarche: age 81 Denies OCP/HRT Denies history of abnormal paps Denies history  of STDs Sexually active   OB History:  OB History  No data available    Family History: Family History  Problem Relation Age of Onset  . Breast cancer Maternal Aunt 80  . Diabetes Mother   . Hypertension Mother   . Peripheral Artery Disease Mother   . Stroke Mother   . Varicose Veins Mother   . Congestive Heart Failure Father   . Heart attack Father     Social History: Social History   Socioeconomic History  . Marital status: Married    Spouse name: Not on file  . Number of children: Not on file  . Years of education: Not on file  . Highest education level: Not on file  Occupational History  . Not on file  Social Needs  . Financial resource strain: Not on file  . Food insecurity:    Worry: Not on file    Inability: Not on file  . Transportation needs:    Medical: Not on file    Non-medical: Not on file  Tobacco Use  . Smoking status: Never Smoker  . Smokeless tobacco: Never Used  Substance and Sexual Activity  . Alcohol use: No  . Drug use: No  . Sexual activity: Yes  Lifestyle  . Physical activity:    Days per week: Not on file    Minutes per session: Not on file  . Stress: Not on file  Relationships  . Social connections:    Talks on phone: Not on file    Gets together: Not on file    Attends religious service: Not on file    Active member of club or organization: Not on file    Attends meetings of clubs or organizations: Not on file    Relationship status: Not on file  . Intimate partner violence:    Fear of current or ex partner: Not on file    Emotionally abused: Not on file    Physically abused: Not on file    Forced sexual activity: Not on file  Other Topics Concern  . Not on file  Social History Narrative  . Not on file    Allergies: Allergies  Allergen Reactions  . Ezetimibe Other (See Comments)     Muscle Pain with all statins   . Lisinopril Cough    Cough   . Norethin-Eth Estrad Biphasic Other (See Comments)     Postmenopausal bleeding. Patient unaware of this  . Statins Other (See Comments)    Muscle pain with diagnosed muscle damage  . Doxycycline Rash  . Erythromycin Other (See Comments)    Caused crazy dreams  . Seasonal Ic [Cholestatin] Other (See Comments)    Runny nose, itchy eyes  . Sulfa Antibiotics Other (See Comments)    Unknown reaction. Patient unaware of when or what happened     Current Medications: Current Outpatient Medications  Medication Sig Dispense Refill  . acetaminophen (TYLENOL) 500 MG tablet Take 1,000-1,500 mg by mouth 2 (two) times daily as needed for moderate pain or headache.    . albuterol (PROVENTIL HFA;VENTOLIN HFA) 108 (90 Base) MCG/ACT inhaler Inhale 2 puffs into the lungs every 6 (six) hours as needed for wheezing or shortness of breath.     Marland Kitchen aspirin EC 81 MG tablet Take 81 mg  by mouth daily.     . benzonatate (TESSALON) 100 MG capsule Take 100 mg by mouth 3 (three) times daily as needed for cough (for allergies).    . diphenhydrAMINE (BENADRYL) 25 MG tablet Take 25 mg by mouth every 6 (six) hours as needed for allergies.    Marland Kitchen doxycycline (VIBRAMYCIN) 100 MG capsule Take 1 capsule (100 mg total) by mouth 2 (two) times daily. (Patient not taking: Reported on 08/15/2017) 14 capsule 0  . DULoxetine (CYMBALTA) 60 MG capsule Take 60 mg by mouth daily.    . fluticasone (FLONASE) 50 MCG/ACT nasal spray Place 2 sprays into both nostrils daily.    Marland Kitchen liraglutide (VICTOZA) 18 MG/3ML SOPN Inject 1.2 mg into the skin daily. For weight loss    . losartan-hydrochlorothiazide (HYZAAR) 100-25 MG per tablet Take 1 tablet by mouth daily.     . meloxicam (MOBIC) 15 MG tablet Take 15 mg by mouth daily.    . metoprolol succinate (TOPROL-XL) 50 MG 24 hr tablet Take 50 mg by mouth daily. In the morning    . Polyethyl Glycol-Propyl Glycol (SYSTANE OP) Place 1 drop into both eyes daily as needed (dry eyes).    . Turmeric 500 MG TABS Take 500 mg by mouth daily.     No current  facility-administered medications for this visit.     Review of Systems General: negative for fevers, chills, fatigue, changes in sleep, changes in weight or appetite Skin: negative for changes in color, texture, moles or lesions Eyes: negative for changes in vision, pain, diplopia HEENT: negative for change in hearing, pain, discharge, tinnitus, vertigo, voice changes, sore throat, neck masses Breasts: negative for breast lumps Pulmonary: negative for dyspnea, orthopnea, productive cough Cardiac: negative for palpitations, syncope, pain, discomfort, pressure Gastrointestinal: negative for dysphagia, nausea, vomiting, jaundice, pain, constipation, diarrhea, hematemesis, hematochezia Genitourinary/Sexual: negative for dysuria, discharge, hesitancy, nocturia, retention, stones, infections, STD's, incontinence Ob/Gyn: negative for irregular bleeding, pain Musculoskeletal: OA of r and l knee. Foot pain Hematology: negative for easy bruising, bleeding Neurologic/Psych: negative for headaches, seizures, paralysis, weakness, tremor, change in gait, change in sensation, mood swings, depression, anxiety, change in memory   Objective:  Physical Examination:  BP 132/68   Pulse 72   Temp 98.1 F (36.7 C) (Oral)   Resp 18   Ht 5\' 4"  (1.626 m)   Wt (!) 317 lb 9.6 oz (144.1 kg)   BMI 54.52 kg/m     ECOG Performance Status: 0 - Asymptomatic  GENERAL: Patient is a well obese appearing female in no acute distress HEENT:  Sclerae anicteric.  Oropharynx clear and moist. No ulcerations or evidence of oropharyngeal candidiasis. Neck is supple.  NODES:  No cervical, supraclavicular, or axillary lymphadenopathy palpated.  LUNGS:  Clear to auscultation bilaterally.  No wheezes or rhonchi. HEART:  Regular rate and rhythm. Heart murmur auscultated, grade 1 heard loudest at aortic ABDOMEN:  Morbid obesity. Prior surgical scar, upper abdomen. Soft, nontender.  Positive, normoactive bowel sounds. No  organomegaly palpated. MSK:  No focal spinal tenderness to palpation. Full range of motion bilaterally in the upper extremities. EXTREMITIES:  No peripheral edema.   SKIN:  Clear with no obvious rashes or skin changes. No nail dyscrasia. NEURO:  Nonfocal. Well oriented.  Appropriate affect.  Pelvic: Exam Chaperoned by NP EGBUS: no lesions seen.  No Bartholin's masses palbated on either side. Cervix: no lesions, nontender, mobile.  IUD string seen. Vagina: no lesions, no discharge or bleeding Uterus: normal size, nontender, mobile: exam  limited by size.  Adnexa: no palpable masses Rectovaginal: confirmatory  Procedure: After consent signed the cervix was cleaned and Tenaculum placed on cervix. Pipelle introduced to 7 cm and tissue sample obtained. Tenaculum removed and no bleeding.  She tolerated the procedure well and there were no complications.     Assessment:  Dawn Oliver is a 72 y.o. female diagnosed with post menopausal bleeding and found to have EIN in a polyp on D&C with Myosure 5/19.  Endometrial currettings were normal and endometrium was about 10 mm thick.  In view of high BMI it was elected to place Progestin IUD in 8/19.  Continues to have some spotting most days.    She also had a benign right Bartholin's cyst that was marsupialized and biopsy was normal.  This has not recurred.   Medical co-morbidities complicating care: Morbid obesity BMI 54.  Plan:   Problem List Items Addressed This Visit      Genitourinary   EIN (endometrial intraepithelial neoplasia)     We discussed options for management including adding oral progestin to IUD if biopsy shows persistent EIN or grade 1 cancer.  Hysterectomy would be an option too, but this would not be easy via abdominal MIS or Vag hyst and might require open procedure.  If cancer would need MRI to assess depth of invasion if hormonal management is to continue.  Radiation would also be an option for treatment of cancer  non-surgically.  The patient's diagnosis, an outline of the further diagnostic and laboratory studies which will be required, the recommendation for surgery, and alternatives were discussed with her.  All questions were answered to their satisfaction.  Will contact her when endometrial biopsy results available.   Mellody Drown, MD   CC:  Benjaman Kindler, [address] [place], [place] [address] [phone]

## 2018-01-07 LAB — SURGICAL PATHOLOGY

## 2018-01-10 ENCOUNTER — Telehealth: Payer: Self-pay | Admitting: Nurse Practitioner

## 2018-01-10 MED ORDER — MEGESTROL ACETATE 40 MG PO TABS: 80.0000 mg | ORAL_TABLET | Freq: Two times a day (BID) | ORAL | 2 refills | Status: DC

## 2018-01-10 NOTE — Telephone Encounter (Signed)
Called patient with results of biopsy and discussion of management options per Dr. Fransisca Connors.   Pathology:  DIAGNOSIS:  A. ENDOMETRIUM; CURETTAGE:  - POLYPOID FRAGMENTS OF INACTIVE ENDOMETRIAL TISSUE WITH STROMAL  DECIDUALIZATION.  - PORTIONS SUGGESTIVE OF INFARCTED POLYP.  - NEGATIVE FOR ATYPICAL HYPERPLASIA/EIN AND MALIGNANCY.   We discussed management options including:   1) observation and repeat endometrial biopsy or D&C in the future if bleeding continues. Might be worth doing another pelvic ultrasound in this case.  2) Add Megace 80 mg bid orally to the progestin she is already getting from the IUD. 3) Hysterectomy  Advised that Dr. Fransisca Connors and Dr. Leafy Ro favored option 2 as she was felt to be a poor surgical candidate and has no evidence of cancer, or EIN, at this time. Advised patient of risks vs benefits of megace including weight gain and role of bmi in EIN. Patient encouraged to monitor weight at home and follow-up with PCP for discussion of weight management. Advised patient that if she experiences bleeding in next month, to return to clinic for evaluation and plan to repeat ultrasound at that time. Otherwise, she can follow up with Dr. Leafy Ro as scheduled in February 2020.

## 2018-01-21 ENCOUNTER — Telehealth: Payer: Self-pay

## 2018-01-21 NOTE — Telephone Encounter (Signed)
Received call from Dawn Oliver stating her insurance had not approved her Megace. Contacted [facility] and they are awaiting PA. They have sent me a PA worksheet. PA also found scanned into the media tab with KEY from cover my meds available. PA submitted via cover my meds. We will notify Dawn Oliver once we hear back from them. She verbalized understanding.

## 2018-01-22 ENCOUNTER — Telehealth: Payer: Self-pay

## 2018-01-22 NOTE — Telephone Encounter (Signed)
Received notification the Megace was denied. Denial reason: Megace is not approved for EIN/vaginal bleeding with IUD in place. Spoke to representative from [employer] [facility] and they are requesting an alternative approved medication. They have sent appeal. Appeal given to Beckey Rutter NP. Dr. Fransisca Connors will be in clinic tomorrow if any changes to prescribed medication need to be made. Dawn Oliver is aware. Oncology Nurse Navigator Documentation  Navigator Location: [facility] (01/22/18 0900)   )Navigator Encounter Type: Telephone (01/22/18 0900) Telephone: Dawn Oliver Call (01/22/18 0900)                                                  Time Spent with Patient: 30 (01/22/18 0900)

## 2018-01-24 ENCOUNTER — Other Ambulatory Visit: Payer: Self-pay | Admitting: Internal Medicine

## 2018-01-24 DIAGNOSIS — Z1231 Encounter for screening mammogram for malignant neoplasm of breast: Secondary | ICD-10-CM

## 2018-01-31 ENCOUNTER — Telehealth: Payer: Self-pay

## 2018-01-31 NOTE — Telephone Encounter (Signed)
Call placed to Ms. Stepp regarding follow up on Megace that was prescribed. She states she received a second denial in the mail. She also reports very little bleeding since her last pelvic exam. I will follow up with Ander Purpura, NP to see where we are with her treatment plan. Oncology Nurse Navigator Documentation  Navigator Location: [facility] (01/31/18 1100)   )Navigator Encounter Type: Telephone (01/31/18 1100) Telephone: Outgoing Call (01/31/18 1100)                                                  Time Spent with Patient: 15 (01/31/18 1100)

## 2018-01-31 NOTE — Telephone Encounter (Signed)
I spoke to [name]. She says she has occassional spot of blood every few days but nothing consistent and has not saturated pads. Abdominal cramping has subsided as well.  We discussed options for obtaining her medication given insurance denial including [facility] which currently can provide a month of medication for approximately $26 which she says she can afford. I will also proceed with appeal of insurance decision.  In light of her symptom improvement and her concerns for side effects and weight gain I will discuss with Dr. Fransisca Connors to decide if surveillance would be acceptable alternative. She requests to hold off on starting medication until we hear from Dr. Fransisca Connors. In regards to weight loss attempts, she says she has been referred to weight management by pcp but first available appt is in May. She will discuss other options in interim with her PCP as well.

## 2018-02-13 ENCOUNTER — Telehealth: Payer: Self-pay | Admitting: Nurse Practitioner

## 2018-02-13 ENCOUNTER — Encounter (INDEPENDENT_AMBULATORY_CARE_PROVIDER_SITE_OTHER): Payer: Self-pay

## 2018-02-13 ENCOUNTER — Ambulatory Visit
Admission: RE | Admit: 2018-02-13 | Discharge: 2018-02-13 | Disposition: A | Discharge status: Home / Self Care | Payer: Medicare Other | Source: Ambulatory Visit | Attending: Internal Medicine | Admitting: Internal Medicine

## 2018-02-13 DIAGNOSIS — Z1231 Encounter for screening mammogram for malignant neoplasm of breast: Secondary | ICD-10-CM | POA: Insufficient documentation

## 2018-02-13 NOTE — Telephone Encounter (Signed)
Called patient to follow up on vaginal bleeding. She states since Christmas approximately 2 episodes of single spot/drop of blood. Discussed with Dr. Fransisca Connors that patient hopes to avoid Megace given concern for side effects if possible. He reviewed her chart and advised that based on this amount of spotting, this is reasonable and acceptable. He requests she continue to follow up with Dr. Leafy Ro as scheduled and call clinic if increased bleeding or other concerning symptoms.

## 2018-03-03 IMAGING — MR MR LUMBAR SPINE W/O CM
4 of 5 series · 27 of 48 positions shown · non-contrast
Comparison: None.

CLINICAL DATA: Lumbar stenosis with neurogenic claudication. Lumbar
radiculopathy

EXAM:
MRI LUMBAR SPINE WITHOUT CONTRAST
TECHNIQUE: Multiplanar, multisequence MR imaging of the lumbar spine was
performed. No intravenous contrast was administered.

[Series 4: T1 · sagittal · 4.0mm · 0.55mm/px · 5 of 13 slices shown (1 of 2)]
[im 1/13]
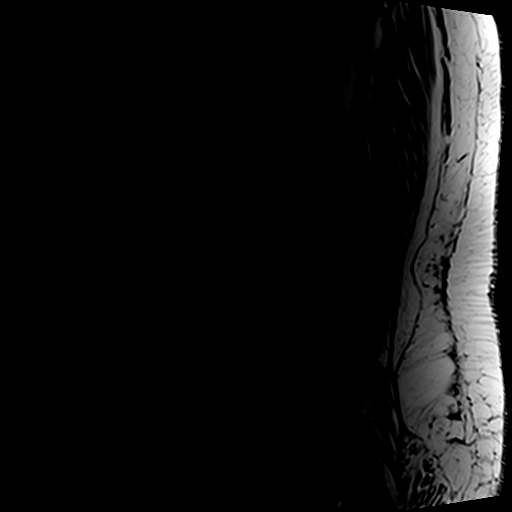
[im 4/13]
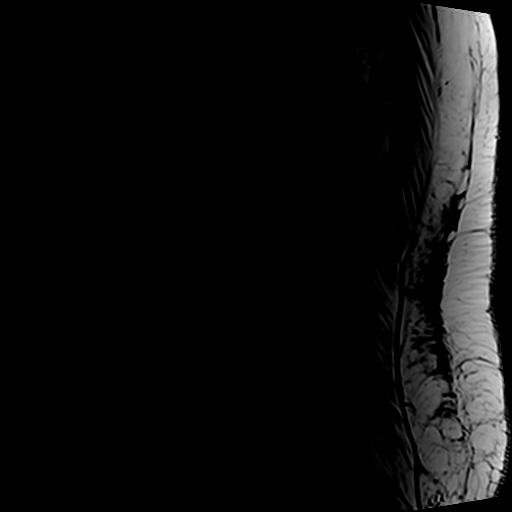
[im 7/13]
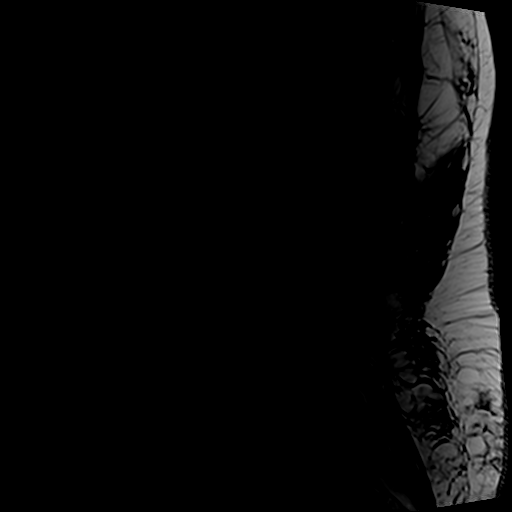
[im 10/13]
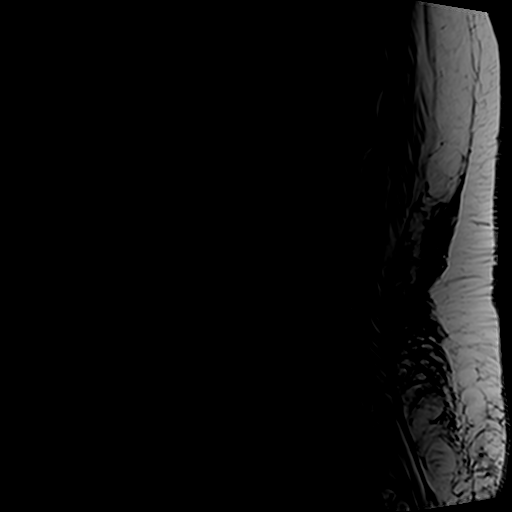
[im 13/13]
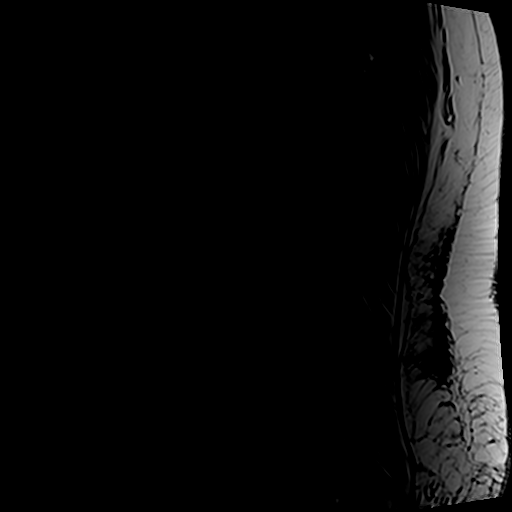

[Series 5: T2 · sagittal · 4.0mm · 0.55mm/px · 5 of 13 slices shown (1 of 2)]
[im 1/13]
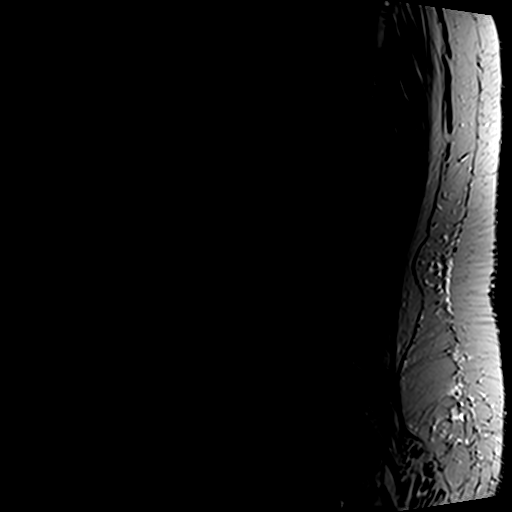
[im 4/13]
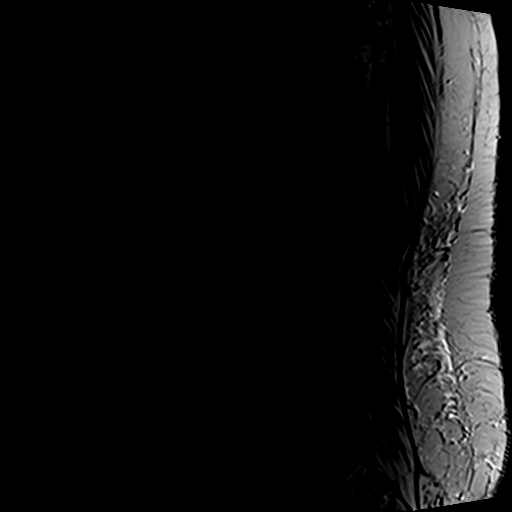
[im 7/13]
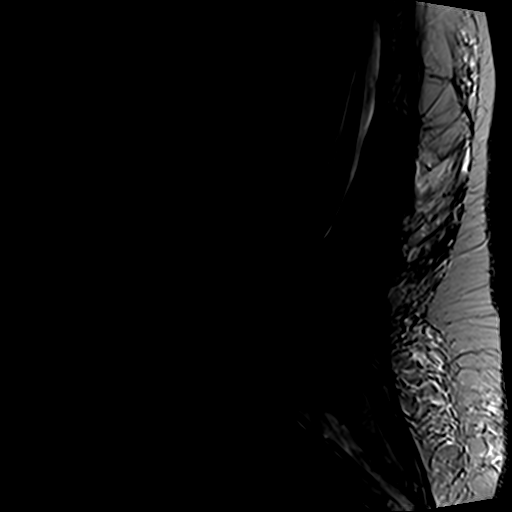
[im 10/13]
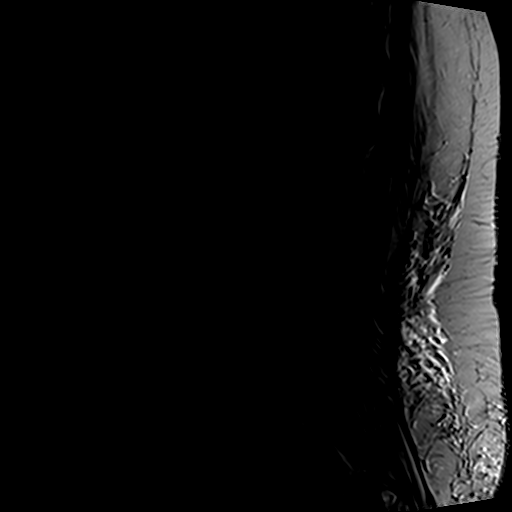
[im 13/13]
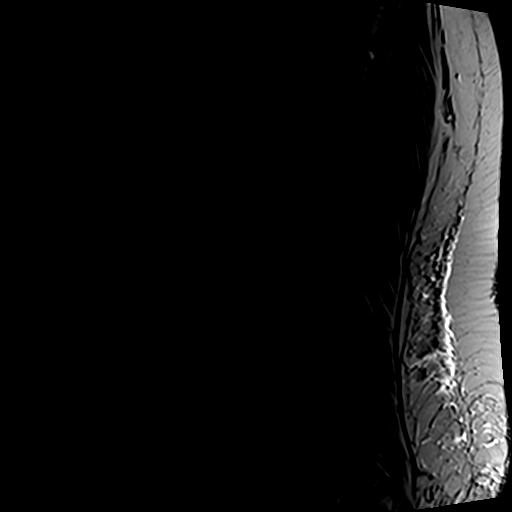

[Series 6: T2 · axial · 4.0mm · 0.70mm/px · z∈[-58,+137]mm · 10 of 39 slices shown (2 of 2)]
[im 3/39]
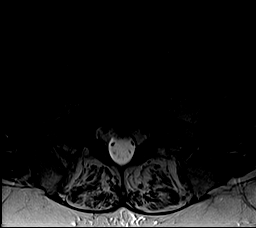
[im 6/39]
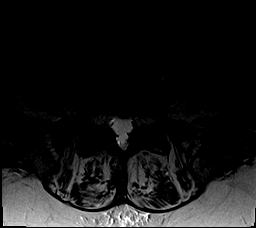
[im 8/39]
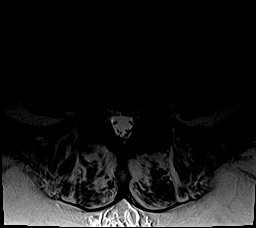
[im 13/39]
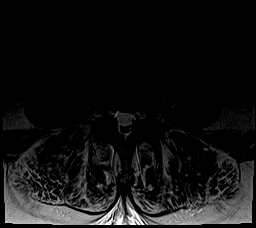
[im 18/39]
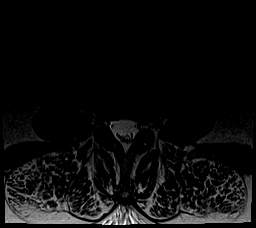
[im 21/39]
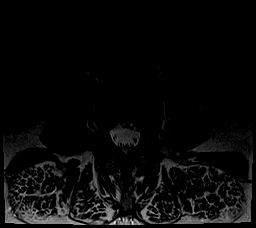
[im 23/39]
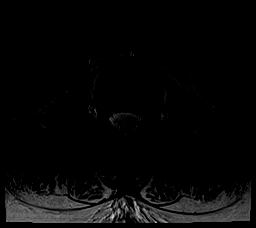
[im 28/39]
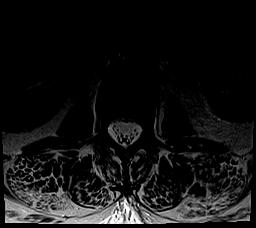
[im 33/39]
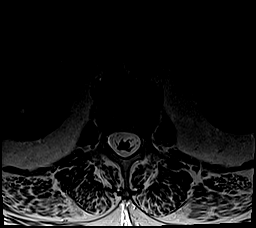
[im 39/39]
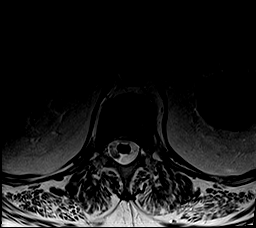

[Series 7: T1 · axial · 4.0mm · 0.35mm/px · z∈[-58,+106]mm · 7 of 39 slices shown (2 of 2)]
[im 3/39]
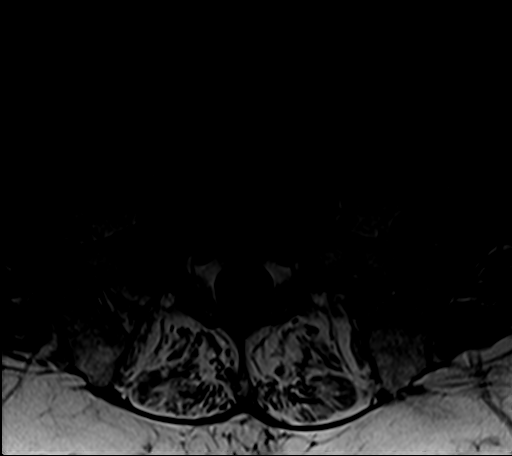
[im 6/39]
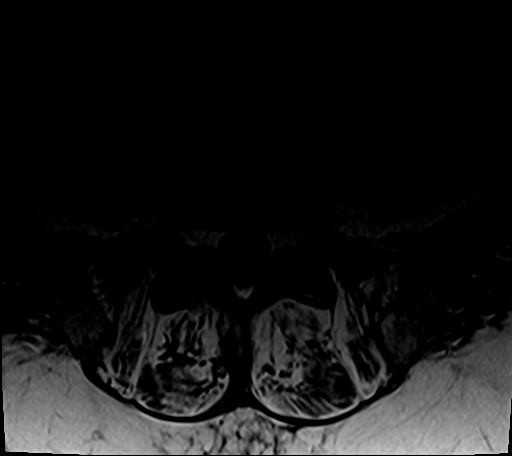
[im 8/39]
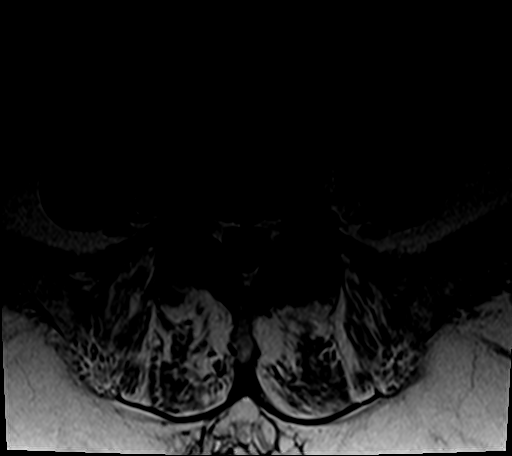
[im 13/39]
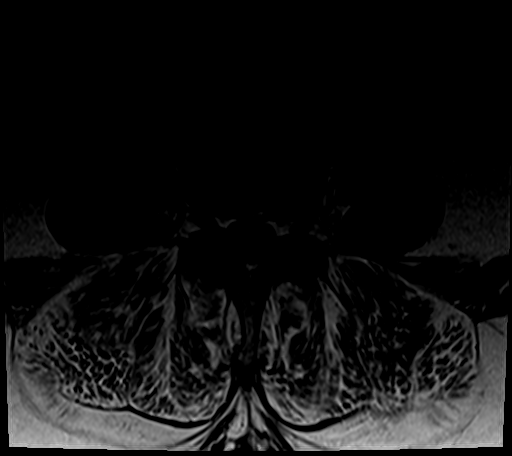
[im 18/39]
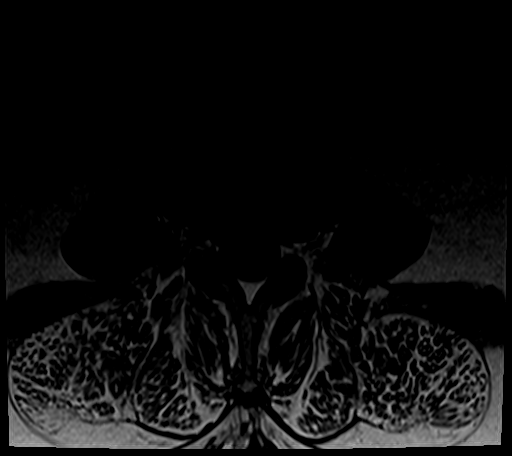
[im 21/39]
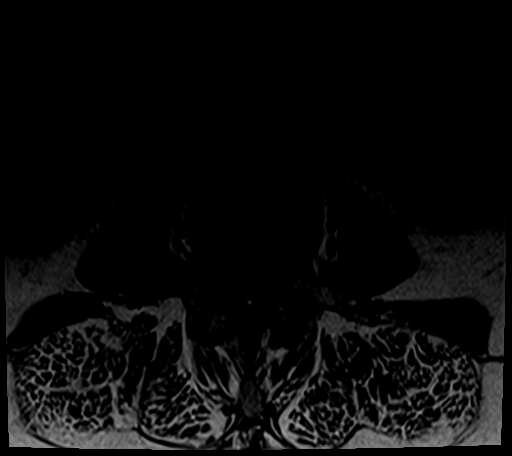
[im 33/39]
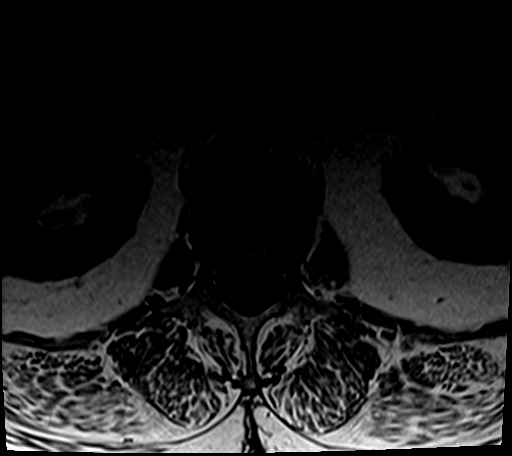

[27 of 48 positions shown; findings below may reference images not displayed]

FINDINGS: Segmentation:  Normal

Alignment:  Mild anterolisthesis L4-5

Vertebrae:  Negative

Conus medullaris: Extends to the L2-3 level and appears normal.

Paraspinal and other soft tissues: Negative

Disc levels:

T12-L1:  Negative

L1-2:  Negative

L2-3: Mild disc and facet degeneration without stenosis. Small left
foraminal disc protrusion. This is causing subarticular stenosis
with possible mild impingement of the left L3 nerve root.

L3-4:  Mild degenerative change

L4-5: Mild anterolisthesis. Moderate facet degeneration. No
significant spinal or foraminal stenosis

L5-S1:  Mild facet degeneration without stenosis.
IMPRESSION: Small left foraminal disc protrusion L2-3 with possible impingement
of the left L3 nerve root.

Mild degenerative changes elsewhere in the lumbar spine as above.

## 2018-04-10 ENCOUNTER — Ambulatory Visit: Payer: Medicare Other | Admitting: Registered Nurse

## 2018-04-10 ENCOUNTER — Ambulatory Visit
Admission: RE | Admit: 2018-04-10 | Discharge: 2018-04-10 | Disposition: A | Discharge status: Home / Self Care | Payer: Medicare Other | Attending: Internal Medicine | Admitting: Internal Medicine

## 2018-04-10 ENCOUNTER — Other Ambulatory Visit: Payer: Self-pay

## 2018-04-10 ENCOUNTER — Encounter
Admission: RE | Disposition: A | Discharge status: Home / Self Care | Payer: Self-pay | Source: Home / Self Care | Attending: Internal Medicine

## 2018-04-10 ENCOUNTER — Encounter: Payer: Self-pay | Admitting: Emergency Medicine

## 2018-04-10 DIAGNOSIS — F419 Anxiety disorder, unspecified: Secondary | ICD-10-CM | POA: Diagnosis not present

## 2018-04-10 DIAGNOSIS — Z1211 Encounter for screening for malignant neoplasm of colon: Secondary | ICD-10-CM | POA: Insufficient documentation

## 2018-04-10 DIAGNOSIS — K5730 Diverticulosis of large intestine without perforation or abscess without bleeding: Secondary | ICD-10-CM | POA: Insufficient documentation

## 2018-04-10 DIAGNOSIS — Z6841 Body Mass Index (BMI) 40.0 and over, adult: Secondary | ICD-10-CM | POA: Insufficient documentation

## 2018-04-10 DIAGNOSIS — Z7982 Long term (current) use of aspirin: Secondary | ICD-10-CM | POA: Diagnosis not present

## 2018-04-10 DIAGNOSIS — Z7951 Long term (current) use of inhaled steroids: Secondary | ICD-10-CM | POA: Insufficient documentation

## 2018-04-10 DIAGNOSIS — Z79899 Other long term (current) drug therapy: Secondary | ICD-10-CM | POA: Insufficient documentation

## 2018-04-10 DIAGNOSIS — E785 Hyperlipidemia, unspecified: Secondary | ICD-10-CM | POA: Diagnosis not present

## 2018-04-10 DIAGNOSIS — G4733 Obstructive sleep apnea (adult) (pediatric): Secondary | ICD-10-CM | POA: Diagnosis not present

## 2018-04-10 DIAGNOSIS — Z86010 Personal history of colonic polyps: Secondary | ICD-10-CM | POA: Diagnosis not present

## 2018-04-10 DIAGNOSIS — M1990 Unspecified osteoarthritis, unspecified site: Secondary | ICD-10-CM | POA: Insufficient documentation

## 2018-04-10 DIAGNOSIS — K640 First degree hemorrhoids: Secondary | ICD-10-CM | POA: Diagnosis not present

## 2018-04-10 DIAGNOSIS — Z791 Long term (current) use of non-steroidal anti-inflammatories (NSAID): Secondary | ICD-10-CM | POA: Insufficient documentation

## 2018-04-10 DIAGNOSIS — I10 Essential (primary) hypertension: Secondary | ICD-10-CM | POA: Insufficient documentation

## 2018-04-10 HISTORY — PX: COLONOSCOPY WITH PROPOFOL: SHX5780

## 2018-04-10 SURGERY — COLONOSCOPY WITH PROPOFOL
Anesthesia: General

## 2018-04-10 MED ORDER — PROPOFOL 10 MG/ML IV BOLUS
INTRAVENOUS | Status: DC | PRN
  Administered 2018-04-10: 100 mg via INTRAVENOUS

## 2018-04-10 MED ORDER — EPHEDRINE SULFATE 50 MG/ML IJ SOLN
INTRAMUSCULAR | Status: DC | PRN
  Administered 2018-04-10: 5 mg via INTRAVENOUS
  Administered 2018-04-10: 10 mg via INTRAVENOUS

## 2018-04-10 MED ORDER — PROPOFOL 500 MG/50ML IV EMUL
INTRAVENOUS | Status: DC | PRN
  Administered 2018-04-10: 100 ug/kg/min via INTRAVENOUS

## 2018-04-10 MED ORDER — SODIUM CHLORIDE 0.9 % IV SOLN
INTRAVENOUS | Status: DC
  Administered 2018-04-10: 1000 mL via INTRAVENOUS

## 2018-04-10 MED ORDER — PHENYLEPHRINE HCL 10 MG/ML IJ SOLN
INTRAMUSCULAR | Status: DC | PRN
  Administered 2018-04-10: 100 ug via INTRAVENOUS
  Administered 2018-04-10: 200 ug via INTRAVENOUS

## 2018-04-10 NOTE — H&P (Signed)
Outpatient short stay form Pre-procedure 04/10/2018 8:51 AM Dawn Oliver, M.D.  Primary Physician: Dawn Oliver, M.D.  Reason for visit:  Personal hx of colon polyps.  History of present illness:                           Patient presents for colonoscopy for a personal hx of colon polyps. The patient denies abdominal pain, abnormal weight loss or rectal bleeding.    No current facility-administered medications for this encounter.   Medications Prior to Admission  Medication Sig Dispense Refill Last Dose  . acetaminophen (TYLENOL) 500 MG tablet Take 1,000-1,500 mg by mouth 2 (two) times daily as needed for moderate pain or headache.   Taking  . albuterol (PROVENTIL HFA;VENTOLIN HFA) 108 (90 Base) MCG/ACT inhaler Inhale 2 puffs into the lungs every 6 (six) hours as needed for wheezing or shortness of breath.    Taking  . aspirin EC 81 MG tablet Take 81 mg by mouth daily.    Taking  . benzonatate (TESSALON) 100 MG capsule Take 100 mg by mouth 3 (three) times daily as needed for cough (for allergies).   Taking  . Cholecalciferol (VITAMIN D3) 25 MCG (1000 UT) CAPS Take 1 capsule by mouth daily.   Taking  . diphenhydrAMINE (BENADRYL) 25 MG tablet Take 25 mg by mouth every 6 (six) hours as needed for allergies.   Taking  . DULoxetine (CYMBALTA) 60 MG capsule Take 60 mg by mouth daily.   Taking  . fluticasone (FLONASE) 50 MCG/ACT nasal spray Place 2 sprays into both nostrils daily.   Taking  . hydrochlorothiazide (HYDRODIURIL) 25 MG tablet Take 25 mg by mouth daily.   Taking  . latanoprost (XALATAN) 0.005 % ophthalmic solution Apply 1 drop to eye at bedtime.   Taking  . losartan (COZAAR) 100 MG tablet Take 100 mg by mouth daily.   Taking  . meloxicam (MOBIC) 15 MG tablet Take 15 mg by mouth daily.   Taking  . metoprolol succinate (TOPROL-XL) 50 MG 24 hr tablet Take 50 mg by mouth daily. In the morning   Taking  . Polyethyl Glycol-Propyl Glycol (SYSTANE OP) Place 1 drop into both eyes daily as  needed (dry eyes).   Taking  . Turmeric 500 MG TABS Take 500 mg by mouth daily.   Taking     Allergies  Allergen Reactions  . Ezetimibe Other (See Comments)     Muscle Pain with all statins   . Lisinopril Cough    Cough   . Norethin-Eth Estrad Biphasic Other (See Comments)    Postmenopausal bleeding. Patient unaware of this  . Statins Other (See Comments)    Muscle pain with diagnosed muscle damage  . Doxycycline Rash  . Erythromycin Other (See Comments)    Caused crazy dreams  . Seasonal Ic [Cholestatin] Other (See Comments)    Runny nose, itchy eyes  . Sulfa Antibiotics Other (See Comments)    Unknown reaction. Patient unaware of when or what happened      Past Medical History:  Diagnosis Date  . Achilles tendon disorder, right   . Allergic rhinitis 08/15/2017  . Anxiety   . Arthritis   . Dermatochalasis 08/15/2017  . Dyspnea    with excercise  . Heart murmur    slight, no treatment  . Hyperlipidemia   . Hypertension   . Lymphadenopathy    right lower extremity, wears support hose  . Meibomian gland dysfunction 08/15/2017  .  Morbid obesity ([place])   . OSA (obstructive sleep apnea) 08/15/2017  . Plantar fasciitis of right foot   . Postmenopausal bleeding 06/2017  . Sleep apnea    uses bipap    Review of systems:  Otherwise negative.    Physical Exam  Gen: Alert, oriented. Appears stated age.  HEENT: [place]/AT. PERRLA. Lungs: CTA, no wheezes. CV: RR nl S1, S2. Abd: soft, benign, no masses. BS+ Ext: No edema. Pulses 2+    Planned procedures: Proceed with colonoscopy. The patient understands the nature of the planned procedure, indications, risks, alternatives and potential complications including but not limited to bleeding, infection, perforation, damage to internal organs and possible oversedation/side effects from anesthesia. The patient agrees and gives consent to proceed.  Please refer to procedure notes for findings, recommendations and patient  disposition/instructions.     Dawn Oliver, M.D. Gastroenterology 04/10/2018  8:51 AM

## 2018-04-10 NOTE — Anesthesia Post-op Follow-up Note (Signed)
Anesthesia QCDR form completed.        

## 2018-04-10 NOTE — Interval H&P Note (Signed)
History and Physical Interval Note:  04/10/2018 11:03 AM  Dawn Oliver  has presented today for surgery, with the diagnosis of PH Colon Polyps  The various methods of treatment have been discussed with the patient and family. After consideration of risks, benefits and other options for treatment, the patient has consented to  Procedure(s): COLONOSCOPY WITH PROPOFOL (N/A) as a surgical intervention .  The patient's history has been reviewed, patient examined, no change in status, stable for surgery.  I have reviewed the patient's chart and labs.  Questions were answered to the patient's satisfaction.     [place], [place]

## 2018-04-10 NOTE — Anesthesia Preprocedure Evaluation (Signed)
Anesthesia Evaluation  Patient identified by MRN, date of birth, ID band Patient awake    Reviewed: Allergy & Precautions, H&P , NPO status , Patient's Chart, lab work & pertinent test results, reviewed documented beta blocker date and time   Airway Mallampati: II   Neck ROM: full    Dental  (+) Poor Dentition   Pulmonary shortness of breath, sleep apnea ,    Pulmonary exam normal        Cardiovascular Exercise Tolerance: Poor hypertension, On Medications Normal cardiovascular exam+ Valvular Problems/Murmurs  Rhythm:regular Rate:Normal     Neuro/Psych Anxiety negative neurological ROS  negative psych ROS   GI/Hepatic negative GI ROS, Neg liver ROS,   Endo/Other  negative endocrine ROS  Renal/GU negative Renal ROS  negative genitourinary   Musculoskeletal   Abdominal   Peds  Hematology negative hematology ROS (+)   Anesthesia Other Findings Past Medical History: No date: Achilles tendon disorder, right 08/15/2017: Allergic rhinitis No date: Anxiety No date: Arthritis 08/15/2017: Dermatochalasis No date: Dyspnea     Comment:  with excercise No date: Heart murmur     Comment:  slight, no treatment No date: Hyperlipidemia No date: Hypertension No date: Lymphadenopathy     Comment:  right lower extremity, wears support hose 08/15/2017: Meibomian gland dysfunction No date: Morbid obesity ([place]) 08/15/2017: OSA (obstructive sleep apnea) No date: Plantar fasciitis of right foot 06/2017: Postmenopausal bleeding No date: Sleep apnea     Comment:  uses bipap Past Surgical History: 07/06/2017: BARTHOLIN GLAND CYST EXCISION     Comment:  Procedure: BARTHOLIN GLAND EXCISION;  Surgeon: Benjaman Kindler, MD;  Location: [facility] ORS;  Service: Gynecology;; 2015: BLEPHAROPLASTY; Bilateral 2012: CARPAL TUNNEL RELEASE; Bilateral No date: CATARACT EXTRACTION 1997: CHOLECYSTECTOMY 2001: COLECTOMY No date: COLON  SURGERY     Comment:  colonoscopy 06/11/2014: COLONOSCOPY; N/A     Comment:  Procedure: COLONOSCOPY;  Surgeon: Hulen Luster, MD;                Location: [facility] ENDOSCOPY;  Service: Gastroenterology;                Laterality: N/A; 2008: EYE SURGERY; Bilateral     Comment:  cataract extractions with lens implants 2008: HYSTEROSCOPY 07/06/2017: HYSTEROSCOPY W/D&C; N/A     Comment:  Procedure: DILATATION AND CURETTAGE /HYSTEROSCOPY;                Surgeon: Benjaman Kindler, MD;  Location: [facility] ORS;                Service: Gynecology;  Laterality: N/A; 2015: SHOULDER OPEN ROTATOR CUFF REPAIR; Right   Reproductive/Obstetrics negative OB ROS                             Anesthesia Physical Anesthesia Plan  ASA: III  Anesthesia Plan: General   Post-op Pain Management:    Induction:   PONV Risk Score and Plan:   Airway Management Planned:   Additional Equipment:   Intra-op Plan:   Post-operative Plan:   Informed Consent: I have reviewed the patients History and Physical, chart, labs and discussed the procedure including the risks, benefits and alternatives for the proposed anesthesia with the patient or authorized representative who has indicated his/her understanding and acceptance.     Dental Advisory Given  Plan Discussed with: CRNA  Anesthesia Plan Comments:  Anesthesia Quick Evaluation  

## 2018-04-10 NOTE — Op Note (Signed)
[facility] Gastroenterology Patient Name: Dawn Oliver Procedure Date: 04/10/2018 10:56 AM MRN: [id number] Account #: [id number] Date of Birth: 1946/01/17 Admit Type: Outpatient Age: 73 Room: [facility] ENDO ROOM 2 Gender: Female Note Status: Finalized Procedure:            Colonoscopy Indications:          High risk colon cancer surveillance: Personal history                        of colonic polyps Providers:            Benay Pike. Alice Reichert MD, MD Referring MD:         [name], MD (Referring MD) Medicines:            Propofol per Anesthesia Complications:        No immediate complications. Procedure:            Pre-Anesthesia Assessment:                       - The risks and benefits of the procedure and the                        sedation options and risks were discussed with the                        patient. All questions were answered and informed                        consent was obtained.                       - Patient identification and proposed procedure were                        verified prior to the procedure by the nurse. The                        procedure was verified in the procedure room.                       - ASA Grade Assessment: II - A patient with mild                        systemic disease.                       - After reviewing the risks and benefits, the patient                        was deemed in satisfactory condition to undergo the                        procedure.                       After obtaining informed consent, the colonoscope was                        passed under direct vision. Throughout the procedure,  the patient's blood pressure, pulse, and oxygen                        saturations were monitored continuously. The                        Colonoscope was introduced through the anus and                        advanced to the the cecum, identified by appendiceal                        orifice  and ileocecal valve. The colonoscopy was                        performed without difficulty. The patient tolerated the                        procedure well. The quality of the bowel preparation                        was good. Findings:      The perianal and digital rectal examinations were normal. Pertinent       negatives include normal sphincter tone and no palpable rectal lesions.      A few small-mouthed diverticula were found in the sigmoid colon.      Non-bleeding internal hemorrhoids were found during retroflexion. The       hemorrhoids were Grade I (internal hemorrhoids that do not prolapse).      The exam was otherwise without abnormality. Impression:           - Diverticulosis in the sigmoid colon.                       - Non-bleeding internal hemorrhoids.                       - The examination was otherwise normal.                       - No specimens collected. Recommendation:       - Patient has a contact number available for                        emergencies. The signs and symptoms of potential                        delayed complications were discussed with the patient.                        Return to normal activities tomorrow. Written discharge                        instructions were provided to the patient.                       - Resume previous diet.                       - Continue present medications.                       -  No repeat colonoscopy due to current age (37 years or                        older).                       - Return to GI office PRN.                       - The findings and recommendations were discussed with                        the patient and their family. Procedure Code(s):    --- Professional ---                       I7867, Colorectal cancer screening; colonoscopy on                        individual at high risk Diagnosis Code(s):    --- Professional ---                       K57.30, Diverticulosis of large intestine without                         perforation or abscess without bleeding                       K64.0, First degree hemorrhoids                       Z86.010, Personal history of colonic polyps CPT copyright 2018 American Medical Association. All rights reserved. The codes documented in this report are preliminary and upon coder review may  be revised to meet current compliance requirements. Efrain Sella MD, MD 04/10/2018 11:28:15 AM This report has been signed electronically. Number of Addenda: 0 Note Initiated On: 04/10/2018 10:56 AM Scope Withdrawal Time: 0 hours 10 minutes 26 seconds  Total Procedure Duration: 0 hours 15 minutes 44 seconds       [facility]

## 2018-04-10 NOTE — Transfer of Care (Signed)
Immediate Anesthesia Transfer of Care Note  Patient: GREDMARIE DELANGE  Procedure(s) Performed: COLONOSCOPY WITH PROPOFOL (N/A )  Patient Location: PACU  Anesthesia Type:General  Level of Consciousness: sedated  Airway & Oxygen Therapy: Patient Spontanous Breathing and Patient connected to nasal cannula oxygen  Post-op Assessment: Report given to RN and Post -op Vital signs reviewed and stable  Post vital signs: Reviewed and stable  Last Vitals:  Vitals Value Taken Time  BP 114/44 04/10/2018 11:33 AM  Temp 36.3 C 04/10/2018 11:33 AM  Pulse 68 04/10/2018 11:33 AM  Resp 37 04/10/2018 11:33 AM  SpO2 97 % 04/10/2018 11:33 AM  Vitals shown include unvalidated device data.  Last Pain:  Vitals:   04/10/18 1129  TempSrc:   PainSc: 0-No pain         Complications: no complications

## 2018-04-10 NOTE — Interval H&P Note (Signed)
History and Physical Interval Note:  04/10/2018 8:52 AM  Dawn Oliver  has presented today for surgery, with the diagnosis of PH Colon Polyps  The various methods of treatment have been discussed with the patient and family. After consideration of risks, benefits and other options for treatment, the patient has consented to  Procedure(s): COLONOSCOPY WITH PROPOFOL (N/A) as a surgical intervention .  The patient's history has been reviewed, patient examined, no change in status, stable for surgery.  I have reviewed the patient's chart and labs.  Questions were answered to the patient's satisfaction.     [place], [place]

## 2018-04-11 ENCOUNTER — Encounter: Payer: Self-pay | Admitting: Internal Medicine

## 2018-04-13 NOTE — Anesthesia Postprocedure Evaluation (Signed)
Anesthesia Post Note  Patient: Dawn Oliver  Procedure(s) Performed: COLONOSCOPY WITH PROPOFOL (N/A )  Patient location during evaluation: PACU Anesthesia Type: General Level of consciousness: awake and alert Pain management: pain level controlled Vital Signs Assessment: post-procedure vital signs reviewed and stable Respiratory status: spontaneous breathing, nonlabored ventilation, respiratory function stable and patient connected to nasal cannula oxygen Cardiovascular status: blood pressure returned to baseline and stable Postop Assessment: no apparent nausea or vomiting Anesthetic complications: no     Last Vitals:  Vitals:   04/10/18 1139 04/10/18 1140  BP: (!) 84/38 (!) 99/41  Pulse: 66   Resp: 19   Temp:    SpO2: 99%     Last Pain:  Vitals:   04/11/18 0734  TempSrc:   PainSc: 0-No pain                 Molli Barrows

## 2019-10-30 ENCOUNTER — Other Ambulatory Visit: Payer: Self-pay | Admitting: Acute Care

## 2019-10-30 DIAGNOSIS — I639 Cerebral infarction, unspecified: Secondary | ICD-10-CM

## 2019-11-17 ENCOUNTER — Ambulatory Visit: Payer: Medicare Other

## 2019-12-30 ENCOUNTER — Other Ambulatory Visit: Payer: Self-pay | Admitting: Internal Medicine

## 2019-12-30 DIAGNOSIS — Z1231 Encounter for screening mammogram for malignant neoplasm of breast: Secondary | ICD-10-CM

## 2020-02-08 ENCOUNTER — Ambulatory Visit
Admission: EM | Admit: 2020-02-08 | Discharge: 2020-02-08 | Disposition: A | Discharge status: Home / Self Care | Payer: Medicare PPO | Attending: Emergency Medicine | Admitting: Emergency Medicine

## 2020-02-08 ENCOUNTER — Other Ambulatory Visit: Payer: Self-pay

## 2020-02-08 DIAGNOSIS — R591 Generalized enlarged lymph nodes: Secondary | ICD-10-CM | POA: Diagnosis not present

## 2020-02-08 DIAGNOSIS — Z20822 Contact with and (suspected) exposure to covid-19: Secondary | ICD-10-CM | POA: Insufficient documentation

## 2020-02-08 NOTE — ED Provider Notes (Signed)
[facility] - [place], [place]   Name: Dawn Oliver DOB: 1945/10/04 MRN: [id number] CSN: [id number] PCP: [name], MD  Arrival date and time:  02/08/20 1144  Chief Complaint:  Covid Exposure   NOTE: Prior to seeing the patient today, I have reviewed the triage nursing documentation and vital signs. Clinical staff has updated patient's PMH/PSHx, current medication list, and drug allergies/intolerances to ensure comprehensive history available to assist in medical decision making.   History:   HPI: Dawn Oliver is a 75 y.o. female who presents today with complaints of the symptoms below.   COVID-19 assessment Symptoms: Headache Symptom onset: 12/31 Vaccination status: Fully vaccinated and boosted Positive exposure: Yes.  Patient was with granddaughter on 12/28.  She was diagnosed with Covid on 12/31. Social precautions: No longer wears mask in public due to vaccination status Employment risk: None Household: Lives with husband Previous COVID-19 test result: No previous positive COVID-19 test   Past Medical History:  Diagnosis Date  . Achilles tendon disorder, right   . Allergic rhinitis 08/15/2017  . Anxiety   . Arthritis   . Dermatochalasis 08/15/2017  . Dyspnea    with excercise  . Heart murmur    slight, no treatment  . Hyperlipidemia   . Hypertension   . Lymphadenopathy    right lower extremity, wears support hose  . Meibomian gland dysfunction 08/15/2017  . Morbid obesity ([place])   . OSA (obstructive sleep apnea) 08/15/2017  . Plantar fasciitis of right foot   . Postmenopausal bleeding 06/2017  . Sleep apnea    uses bipap    Past Surgical History:  Procedure Laterality Date  . BARTHOLIN GLAND CYST EXCISION  07/06/2017   Procedure: BARTHOLIN GLAND EXCISION;  Surgeon: Benjaman Kindler, MD;  Location: [facility] ORS;  Service: Gynecology;;  . BLEPHAROPLASTY Bilateral 2015  . CARPAL TUNNEL RELEASE Bilateral 2012  . CATARACT EXTRACTION    .  CHOLECYSTECTOMY  1997  . COLECTOMY  2001  . COLON SURGERY     colonoscopy  . COLONOSCOPY N/A 06/11/2014   Procedure: COLONOSCOPY;  Surgeon: Hulen Luster, MD;  Location: [facility] ENDOSCOPY;  Service: Gastroenterology;  Laterality: N/A;  . COLONOSCOPY WITH PROPOFOL N/A 04/10/2018   Procedure: COLONOSCOPY WITH PROPOFOL;  Surgeon: [place], Benay Pike, MD;  Location: [facility] ENDOSCOPY;  Service: Gastroenterology;  Laterality: N/A;  . EYE SURGERY Bilateral 2008   cataract extractions with lens implants  . HYSTEROSCOPY  2008  . HYSTEROSCOPY WITH D & C N/A 07/06/2017   Procedure: DILATATION AND CURETTAGE /HYSTEROSCOPY;  Surgeon: Benjaman Kindler, MD;  Location: [facility] ORS;  Service: Gynecology;  Laterality: N/A;  . SHOULDER OPEN ROTATOR CUFF REPAIR Right 2015    Family History  Problem Relation Age of Onset  . Breast cancer Maternal Aunt 80  . Diabetes Mother   . Hypertension Mother   . Peripheral Artery Disease Mother   . Stroke Mother   . Varicose Veins Mother   . Congestive Heart Failure Father   . Heart attack Father     Social History   Tobacco Use  . Smoking status: Never Smoker  . Smokeless tobacco: Never Used  Vaping Use  . Vaping Use: Never used  Substance Use Topics  . Alcohol use: No  . Drug use: No    Patient Active Problem List   Diagnosis Date Noted  . 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 Meibomian gland dysfunction 08/15/2017  . Dermatochalasis 08/15/2017  . OSA (obstructive sleep apnea) 08/15/2017  .  Allergic rhinitis 08/15/2017  . Chronic venous insufficiency 11/11/2015  . Lymphedema 11/11/2015  . Bilateral leg pain 11/11/2015    Home Medications:    No outpatient medications have been marked as taking for the 02/08/20 encounter [facility] Encounter).    Allergies:   Ezetimibe, Lisinopril, Norethindrone-eth estradiol, Statins, Doxycycline, Erythromycin, Seasonal ic [cholestatin], and Sulfa antibiotics  Review of Systems (ROS): Review of Systems   Neurological: Positive for headaches.  All other systems reviewed and are negative.    Vital Signs: Today's Vitals   02/08/20 1432 02/08/20 1433 02/08/20 1542  BP:  (!) 158/54   Pulse:  67   Resp:  19   Temp:  98 F (36.7 C)   TempSrc:  Oral   SpO2:  100%   Weight: (!) 310 lb (140.6 kg)    Height: 5\' 4"  (1.626 m)    PainSc: 7   7     Physical Exam: Physical Exam Vitals and nursing note reviewed.  Constitutional:      Appearance: Normal appearance.  Cardiovascular:     Rate and Rhythm: Normal rate and regular rhythm.     Pulses: Normal pulses.     Heart sounds: Normal heart sounds.  Pulmonary:     Breath sounds: Normal breath sounds.  Lymphadenopathy:     Head:     Right side of head: Posterior auricular adenopathy present. No tonsillar or preauricular adenopathy.  Skin:    General: Skin is warm and dry.  Neurological:     Mental Status: She is alert.  Psychiatric:        Mood and Affect: Mood normal.        Behavior: Behavior normal.        Thought Content: Thought content normal.      Urgent Care Treatments / Results:   LABS: PLEASE NOTE: all labs that were ordered this encounter are listed, however only abnormal results are displayed. Labs Reviewed  SARS CORONAVIRUS 2 (TAT 6-24 HRS)    EKG: -None  RADIOLOGY: No results found.  PROCEDURES: Procedures  MEDICATIONS RECEIVED THIS VISIT: Medications - No data to display  PERTINENT CLINICAL COURSE NOTES/UPDATES:   Initial Impression / Assessment and Plan / Urgent Care Course:  Pertinent labs & imaging results that were available during my care of the patient were personally reviewed by me and considered in my medical decision making (see lab/imaging section of note for values and interpretations).  Dawn Oliver is a 76 y.o. female who presents to [facility] today with complaints of a headache, diagnosed with lymphadenopathy, and treated as such with the directions below. NP and patient  reviewed discharge instructions below during visit.   Patient is well appearing overall in clinic today. She does not appear to be in any acute distress. Presenting symptoms (see HPI) and exam as documented above.   I have reviewed the follow up and strict return precautions for any new or worsening symptoms. Patient is aware of symptoms that would be deemed urgent/emergent, and would thus require further evaluation either here or in the emergency department. At the time of discharge, she verbalized understanding and consent with the discharge plan as it was reviewed with her. All questions were fielded by provider and/or clinic staff prior to patient discharge.    Final Clinical Impressions / Urgent Care Diagnoses:   Final diagnoses:  Lymphadenopathy  Exposure to confirmed case of COVID-19    New Prescriptions:  [place] Controlled Substance Registry consulted? Not Applicable  No orders  of the defined types were placed in this encounter.     Discharge Instructions     You were seen for headache and are being treated for lymphadenopathy and exposure to COVID-19.   Use over-the-counter Tylenol and cold compresses to the area of swelling.  You were tested for COVID-19. If you know you were exposed to a confirmed case of COVID-19, continue quarantine until your test results are available.  Interact as little as possible until COVID-19 test results are available.  Continue to keep her social distance of 6 feet from others, wash hands frequently and wear facemask when indoors or when you are unable to social distance in outdoor settings.  If you develop any symptoms, reach out to the urgent care for further instructions.  If your test results are positive, a member of the urgent care team will reach out to you with further instructions. If you have any further questions, please don't hesitate to reach out to the urgent care clinic.  Over the counter medications that will help your symptoms: Flonase  (runny nose, congestion), Zyrtec or Xyzal (postnasal drip, sneezing), Tylenol/Ibuprofen (fever and body aches).  Please sign up for MyChart to access your lab results.  Take care, Dr. Marland Kitchen, NP-c'    Recommended Follow up Care:  Patient encouraged to follow up with the following provider within the specified time frame, or sooner as dictated by the severity of her symptoms. As always, she was instructed that for any urgent/emergent care needs, she should seek care either here or in the emergency department for more immediate evaluation.   Gertie Baron, DNP, NP-c    Gertie Baron, NP 02/08/20 [phone]

## 2020-02-08 NOTE — Discharge Instructions (Addendum)
You were seen for headache and are being treated for lymphadenopathy and exposure to COVID-19.   Use over-the-counter Tylenol and cold compresses to the area of swelling.  You were tested for COVID-19. If you know you were exposed to a confirmed case of COVID-19, continue quarantine until your test results are available.  Interact as little as possible until COVID-19 test results are available.  Continue to keep her social distance of 6 feet from others, wash hands frequently and wear facemask when indoors or when you are unable to social distance in outdoor settings.  If you develop any symptoms, reach out to the urgent care for further instructions.  If your test results are positive, a member of the urgent care team will reach out to you with further instructions. If you have any further questions, please don't hesitate to reach out to the urgent care clinic.  Over the counter medications that will help your symptoms: Flonase (runny nose, congestion), Zyrtec or Xyzal (postnasal drip, sneezing), Tylenol/Ibuprofen (fever and body aches).  Please sign up for MyChart to access your lab results.  Take care, Dr. Sharlet [place], NP-c'

## 2020-02-08 NOTE — ED Triage Notes (Addendum)
Pt with positive COVID exposure on Tuesday. Pt began with right sided headache 3 days later. Right side of head is tender to touch

## 2020-02-09 LAB — SARS CORONAVIRUS 2 (TAT 6-24 HRS): SARS Coronavirus 2: NEGATIVE

## 2020-04-13 ENCOUNTER — Ambulatory Visit
Admission: RE | Admit: 2020-04-13 | Discharge: 2020-04-13 | Disposition: A | Discharge status: Home / Self Care | Payer: Medicare PPO | Source: Ambulatory Visit | Attending: Internal Medicine | Admitting: Internal Medicine

## 2020-04-13 ENCOUNTER — Other Ambulatory Visit: Payer: Self-pay

## 2020-04-13 DIAGNOSIS — Z1231 Encounter for screening mammogram for malignant neoplasm of breast: Secondary | ICD-10-CM | POA: Diagnosis not present

## 2021-02-16 ENCOUNTER — Other Ambulatory Visit: Payer: Self-pay | Admitting: Family Medicine

## 2021-02-16 DIAGNOSIS — R051 Acute cough: Secondary | ICD-10-CM

## 2021-02-16 DIAGNOSIS — R042 Hemoptysis: Secondary | ICD-10-CM

## 2021-03-08 ENCOUNTER — Ambulatory Visit
Admission: RE | Admit: 2021-03-08 | Discharge: 2021-03-08 | Disposition: A | Discharge status: Home / Self Care | Payer: Medicare PPO | Source: Ambulatory Visit | Attending: Family Medicine | Admitting: Family Medicine

## 2021-03-08 ENCOUNTER — Other Ambulatory Visit: Payer: Self-pay

## 2021-03-08 DIAGNOSIS — R042 Hemoptysis: Secondary | ICD-10-CM | POA: Diagnosis not present

## 2021-03-08 DIAGNOSIS — R051 Acute cough: Secondary | ICD-10-CM | POA: Insufficient documentation

## 2021-09-12 ENCOUNTER — Other Ambulatory Visit: Payer: Self-pay | Admitting: Internal Medicine

## 2021-09-12 ENCOUNTER — Other Ambulatory Visit: Payer: Self-pay | Admitting: Gerontology

## 2021-09-12 DIAGNOSIS — Z1231 Encounter for screening mammogram for malignant neoplasm of breast: Secondary | ICD-10-CM

## 2021-09-13 ENCOUNTER — Ambulatory Visit
Admission: RE | Admit: 2021-09-13 | Discharge: 2021-09-13 | Disposition: A | Discharge status: Home / Self Care | Payer: Medicare PPO | Source: Ambulatory Visit | Attending: Gerontology | Admitting: Gerontology

## 2021-09-13 DIAGNOSIS — Z1231 Encounter for screening mammogram for malignant neoplasm of breast: Secondary | ICD-10-CM | POA: Insufficient documentation

## 2022-04-18 DIAGNOSIS — L02212 Cutaneous abscess of back [any part, except buttock]: Secondary | ICD-10-CM | POA: Diagnosis not present

## 2022-04-28 DIAGNOSIS — G4733 Obstructive sleep apnea (adult) (pediatric): Secondary | ICD-10-CM | POA: Diagnosis not present

## 2022-05-01 DIAGNOSIS — M21621 Bunionette of right foot: Secondary | ICD-10-CM | POA: Diagnosis not present

## 2022-05-01 DIAGNOSIS — R7303 Prediabetes: Secondary | ICD-10-CM | POA: Diagnosis not present

## 2022-05-01 DIAGNOSIS — D2371 Other benign neoplasm of skin of right lower limb, including hip: Secondary | ICD-10-CM | POA: Diagnosis not present

## 2022-05-23 DIAGNOSIS — H35351 Cystoid macular degeneration, right eye: Secondary | ICD-10-CM | POA: Diagnosis not present

## 2022-06-12 DIAGNOSIS — N8502 Endometrial intraepithelial neoplasia [EIN]: Secondary | ICD-10-CM | POA: Diagnosis not present

## 2022-07-26 DIAGNOSIS — R200 Anesthesia of skin: Secondary | ICD-10-CM | POA: Diagnosis not present

## 2022-07-26 DIAGNOSIS — R4701 Aphasia: Secondary | ICD-10-CM | POA: Diagnosis not present

## 2022-07-26 DIAGNOSIS — G4733 Obstructive sleep apnea (adult) (pediatric): Secondary | ICD-10-CM | POA: Diagnosis not present

## 2022-07-26 DIAGNOSIS — G479 Sleep disorder, unspecified: Secondary | ICD-10-CM | POA: Diagnosis not present

## 2022-07-26 DIAGNOSIS — R202 Paresthesia of skin: Secondary | ICD-10-CM | POA: Diagnosis not present

## 2022-07-27 ENCOUNTER — Other Ambulatory Visit: Payer: Self-pay

## 2022-07-27 DIAGNOSIS — E782 Mixed hyperlipidemia: Secondary | ICD-10-CM | POA: Diagnosis not present

## 2022-07-27 DIAGNOSIS — Z1231 Encounter for screening mammogram for malignant neoplasm of breast: Secondary | ICD-10-CM

## 2022-07-27 DIAGNOSIS — E538 Deficiency of other specified B group vitamins: Secondary | ICD-10-CM | POA: Diagnosis not present

## 2022-07-27 DIAGNOSIS — G4733 Obstructive sleep apnea (adult) (pediatric): Secondary | ICD-10-CM | POA: Diagnosis not present

## 2022-07-27 DIAGNOSIS — F419 Anxiety disorder, unspecified: Secondary | ICD-10-CM | POA: Diagnosis not present

## 2022-07-27 DIAGNOSIS — I10 Essential (primary) hypertension: Secondary | ICD-10-CM | POA: Diagnosis not present

## 2022-07-27 DIAGNOSIS — Z09 Encounter for follow-up examination after completed treatment for conditions other than malignant neoplasm: Secondary | ICD-10-CM | POA: Diagnosis not present

## 2022-07-27 DIAGNOSIS — E559 Vitamin D deficiency, unspecified: Secondary | ICD-10-CM | POA: Diagnosis not present

## 2022-07-27 DIAGNOSIS — J302 Other seasonal allergic rhinitis: Secondary | ICD-10-CM | POA: Diagnosis not present

## 2022-07-27 DIAGNOSIS — M170 Bilateral primary osteoarthritis of knee: Secondary | ICD-10-CM | POA: Diagnosis not present

## 2022-07-27 DIAGNOSIS — I119 Hypertensive heart disease without heart failure: Secondary | ICD-10-CM | POA: Diagnosis not present

## 2022-08-17 DIAGNOSIS — H40023 Open angle with borderline findings, high risk, bilateral: Secondary | ICD-10-CM | POA: Diagnosis not present

## 2022-08-17 DIAGNOSIS — Z961 Presence of intraocular lens: Secondary | ICD-10-CM | POA: Diagnosis not present

## 2022-09-18 ENCOUNTER — Ambulatory Visit: Payer: Medicare PPO

## 2022-09-19 ENCOUNTER — Ambulatory Visit
Admission: RE | Admit: 2022-09-19 | Discharge: 2022-09-19 | Disposition: A | Discharge status: Home / Self Care | Payer: Medicare PPO | Source: Ambulatory Visit | Attending: Gerontology | Admitting: Gerontology

## 2022-09-19 DIAGNOSIS — Z1231 Encounter for screening mammogram for malignant neoplasm of breast: Secondary | ICD-10-CM | POA: Diagnosis not present

## 2022-09-19 DIAGNOSIS — M170 Bilateral primary osteoarthritis of knee: Secondary | ICD-10-CM | POA: Diagnosis not present

## 2022-09-19 DIAGNOSIS — Z6841 Body Mass Index (BMI) 40.0 and over, adult: Secondary | ICD-10-CM | POA: Diagnosis not present

## 2022-11-21 DIAGNOSIS — H35351 Cystoid macular degeneration, right eye: Secondary | ICD-10-CM | POA: Diagnosis not present

## 2022-12-18 DIAGNOSIS — H40023 Open angle with borderline findings, high risk, bilateral: Secondary | ICD-10-CM | POA: Diagnosis not present

## 2022-12-18 DIAGNOSIS — Z961 Presence of intraocular lens: Secondary | ICD-10-CM | POA: Diagnosis not present

## 2023-01-08 DIAGNOSIS — B372 Candidiasis of skin and nail: Secondary | ICD-10-CM | POA: Diagnosis not present

## 2023-01-08 DIAGNOSIS — Z872 Personal history of diseases of the skin and subcutaneous tissue: Secondary | ICD-10-CM | POA: Diagnosis not present

## 2023-01-08 DIAGNOSIS — L578 Other skin changes due to chronic exposure to nonionizing radiation: Secondary | ICD-10-CM | POA: Diagnosis not present

## 2023-01-11 DIAGNOSIS — M25561 Pain in right knee: Secondary | ICD-10-CM | POA: Diagnosis not present

## 2023-01-11 DIAGNOSIS — M170 Bilateral primary osteoarthritis of knee: Secondary | ICD-10-CM | POA: Diagnosis not present

## 2023-01-26 DIAGNOSIS — E782 Mixed hyperlipidemia: Secondary | ICD-10-CM | POA: Diagnosis not present

## 2023-01-26 DIAGNOSIS — I10 Essential (primary) hypertension: Secondary | ICD-10-CM | POA: Diagnosis not present

## 2023-01-26 DIAGNOSIS — Z23 Encounter for immunization: Secondary | ICD-10-CM | POA: Diagnosis not present

## 2023-01-26 DIAGNOSIS — E88819 Insulin resistance, unspecified: Secondary | ICD-10-CM | POA: Diagnosis not present

## 2023-01-26 DIAGNOSIS — E538 Deficiency of other specified B group vitamins: Secondary | ICD-10-CM | POA: Diagnosis not present

## 2023-01-26 DIAGNOSIS — R7303 Prediabetes: Secondary | ICD-10-CM | POA: Diagnosis not present

## 2023-01-26 DIAGNOSIS — Z79899 Other long term (current) drug therapy: Secondary | ICD-10-CM | POA: Diagnosis not present

## 2023-01-26 DIAGNOSIS — M8589 Other specified disorders of bone density and structure, multiple sites: Secondary | ICD-10-CM | POA: Diagnosis not present

## 2023-01-26 DIAGNOSIS — Z1331 Encounter for screening for depression: Secondary | ICD-10-CM | POA: Diagnosis not present

## 2023-01-26 DIAGNOSIS — K056 Periodontal disease, unspecified: Secondary | ICD-10-CM | POA: Diagnosis not present

## 2023-01-26 DIAGNOSIS — Z0000 Encounter for general adult medical examination without abnormal findings: Secondary | ICD-10-CM | POA: Diagnosis not present

## 2023-01-26 DIAGNOSIS — Z6841 Body Mass Index (BMI) 40.0 and over, adult: Secondary | ICD-10-CM | POA: Diagnosis not present

## 2023-01-26 DIAGNOSIS — F419 Anxiety disorder, unspecified: Secondary | ICD-10-CM | POA: Diagnosis not present

## 2023-01-26 DIAGNOSIS — E559 Vitamin D deficiency, unspecified: Secondary | ICD-10-CM | POA: Diagnosis not present

## 2023-04-24 DIAGNOSIS — Z6841 Body Mass Index (BMI) 40.0 and over, adult: Secondary | ICD-10-CM | POA: Diagnosis not present

## 2023-04-24 DIAGNOSIS — M170 Bilateral primary osteoarthritis of knee: Secondary | ICD-10-CM | POA: Diagnosis not present

## 2023-06-08 DIAGNOSIS — Z961 Presence of intraocular lens: Secondary | ICD-10-CM | POA: Diagnosis not present

## 2023-06-08 DIAGNOSIS — H40023 Open angle with borderline findings, high risk, bilateral: Secondary | ICD-10-CM | POA: Diagnosis not present

## 2023-06-12 DIAGNOSIS — N8502 Endometrial intraepithelial neoplasia [EIN]: Secondary | ICD-10-CM | POA: Diagnosis not present

## 2023-06-19 DIAGNOSIS — H35351 Cystoid macular degeneration, right eye: Secondary | ICD-10-CM | POA: Diagnosis not present

## 2023-06-20 DIAGNOSIS — M1711 Unilateral primary osteoarthritis, right knee: Secondary | ICD-10-CM | POA: Diagnosis not present

## 2023-07-25 DIAGNOSIS — G479 Sleep disorder, unspecified: Secondary | ICD-10-CM | POA: Diagnosis not present

## 2023-07-25 DIAGNOSIS — R200 Anesthesia of skin: Secondary | ICD-10-CM | POA: Diagnosis not present

## 2023-07-25 DIAGNOSIS — G4733 Obstructive sleep apnea (adult) (pediatric): Secondary | ICD-10-CM | POA: Diagnosis not present

## 2023-07-25 DIAGNOSIS — R4701 Aphasia: Secondary | ICD-10-CM | POA: Diagnosis not present

## 2023-07-25 DIAGNOSIS — I6523 Occlusion and stenosis of bilateral carotid arteries: Secondary | ICD-10-CM | POA: Diagnosis not present

## 2023-07-25 DIAGNOSIS — R202 Paresthesia of skin: Secondary | ICD-10-CM | POA: Diagnosis not present

## 2023-08-28 ENCOUNTER — Other Ambulatory Visit: Payer: Self-pay | Admitting: Gerontology

## 2023-08-28 DIAGNOSIS — Z1231 Encounter for screening mammogram for malignant neoplasm of breast: Secondary | ICD-10-CM

## 2023-08-28 DIAGNOSIS — Z09 Encounter for follow-up examination after completed treatment for conditions other than malignant neoplasm: Secondary | ICD-10-CM | POA: Diagnosis not present

## 2023-08-28 DIAGNOSIS — E782 Mixed hyperlipidemia: Secondary | ICD-10-CM | POA: Diagnosis not present

## 2023-08-28 DIAGNOSIS — J302 Other seasonal allergic rhinitis: Secondary | ICD-10-CM | POA: Diagnosis not present

## 2023-08-28 DIAGNOSIS — I10 Essential (primary) hypertension: Secondary | ICD-10-CM | POA: Diagnosis not present

## 2023-08-28 DIAGNOSIS — F419 Anxiety disorder, unspecified: Secondary | ICD-10-CM | POA: Diagnosis not present

## 2023-08-28 DIAGNOSIS — E538 Deficiency of other specified B group vitamins: Secondary | ICD-10-CM | POA: Diagnosis not present

## 2023-08-28 DIAGNOSIS — Z1331 Encounter for screening for depression: Secondary | ICD-10-CM | POA: Diagnosis not present

## 2023-08-28 DIAGNOSIS — Z789 Other specified health status: Secondary | ICD-10-CM | POA: Diagnosis not present

## 2023-08-28 DIAGNOSIS — G4733 Obstructive sleep apnea (adult) (pediatric): Secondary | ICD-10-CM | POA: Diagnosis not present

## 2023-08-31 ENCOUNTER — Encounter (INDEPENDENT_AMBULATORY_CARE_PROVIDER_SITE_OTHER): Payer: Self-pay | Admitting: Vascular Surgery

## 2023-08-31 ENCOUNTER — Ambulatory Visit (INDEPENDENT_AMBULATORY_CARE_PROVIDER_SITE_OTHER): Payer: No Typology Code available for payment source | Admitting: Vascular Surgery

## 2023-08-31 VITALS — BP 133/72 | HR 69 | Ht 62.0 in | Wt 299.0 lb

## 2023-08-31 DIAGNOSIS — E785 Hyperlipidemia, unspecified: Secondary | ICD-10-CM

## 2023-08-31 DIAGNOSIS — I890 Lymphedema, not elsewhere classified: Secondary | ICD-10-CM | POA: Diagnosis not present

## 2023-08-31 DIAGNOSIS — I6523 Occlusion and stenosis of bilateral carotid arteries: Secondary | ICD-10-CM

## 2023-08-31 DIAGNOSIS — I10 Essential (primary) hypertension: Secondary | ICD-10-CM | POA: Diagnosis not present

## 2023-08-31 DIAGNOSIS — I6529 Occlusion and stenosis of unspecified carotid artery: Secondary | ICD-10-CM | POA: Insufficient documentation

## 2023-08-31 NOTE — Assessment & Plan Note (Signed)
 Swelling is currently under good control

## 2023-08-31 NOTE — Progress Notes (Signed)
 Patient ID: Dawn Oliver, female   DOB: [birth year], 78 y.o.   MRN: [id number]  Chief Complaint  Patient presents with   np. consult. ONLY WANTS TO SEE JD. bilateral carotid artery    HPI Dawn Oliver is a 78 y.o. female.  I am asked to see the patient by Clotilda Leaven for evaluation of carotid stenosis.  Have done bilateral carotid revascularization for her brother and she has other strong family history of atherosclerotic vascular disease.  With medical risk factors and strong family history, her primary care provider astutely ordered a carotid duplex performed earlier this month which I have reviewed.  This is interpreted as 50 to 69% bilateral ICA stenosis.  By our grading criteria, we would describe this as 40 to 59% bilateral carotid artery stenosis.  She has not had any focal neurologic symptoms. Specifically, the patient denies amaurosis fugax, speech or swallowing difficulties, or arm or leg weakness or numbness       Past Medical History:  Diagnosis Date   Achilles tendon disorder, right    Allergic rhinitis 08/15/2017   Anxiety    Arthritis    Dermatochalasis 08/15/2017   Dyspnea    with excercise   Heart murmur    slight, no treatment   Hyperlipidemia    Hypertension    Lymphadenopathy    right lower extremity, wears support hose   Meibomian gland dysfunction 08/15/2017   Morbid obesity (HCC)    OSA (obstructive sleep apnea) 08/15/2017   Plantar fasciitis of right foot    Postmenopausal bleeding 06/2017   Sleep apnea    uses bipap    Past Surgical History:  Procedure Laterality Date   BARTHOLIN GLAND CYST EXCISION  07/06/2017   Procedure: BARTHOLIN GLAND EXCISION;  Surgeon: Verdon Keen, MD;  Location: [facility] ORS;  Service: Gynecology;;   BLEPHAROPLASTY Bilateral 2015   CARPAL TUNNEL RELEASE Bilateral 2012   CATARACT EXTRACTION     CHOLECYSTECTOMY  1997   COLECTOMY  2001   COLON SURGERY     colonoscopy   COLONOSCOPY N/A 06/11/2014   Procedure:  COLONOSCOPY;  Surgeon: Deward CINDERELLA Piedmont, MD;  Location: [facility] ENDOS[facility];  Service: Gastroenterology;  Laterality: N/A;   COLONOSCOPY WITH PROPOFOL  N/A 04/10/2018   Procedure: COLONOSCOPY WITH PROPOFOL ;  Surgeon: Toledo, Ladell POUR, MD;  Location: [facility] ENDOSCOPY;  Service: Gastroenterology;  Laterality: N/A;   EYE SURGERY Bilateral 2008   cataract extractions with lens implants   HYSTEROSCOPY  2008   HYSTEROSCOPY WITH D & C N/A 07/06/2017   Procedure: DILATATION AND CURETTAGE /HYSTEROSCOPY;  Surgeon: Verdon Keen, MD;  Location: [facility] OR[facility];  Service: Gynecology;  Laterality: N/A;   SHOULDER OPEN ROTATOR CUFF REPAIR Right 2015     Family History  Problem Relation Age of Onset   Breast cancer Maternal Aunt 80   Diabetes Mother    Hypertension Mother    Peripheral Artery Disease Mother    Stroke Mother    Varicose Veins Mother    Congestive Heart Failure Father    Heart attack Father   Brother has had carotid disease    Social History   Tobacco Use   Smoking status: Never   Smokeless tobacco: Never  Vaping Use   Vaping status: Never Used  Substance Use Topics   Alcohol  use: No   Drug use: No     Allergies  Allergen Reactions   Ezetimibe Other (See Comments)     Muscle Pain with all statins  Lisinopril Cough    Cough    Norethindrone-Eth Estradiol Other (See Comments)    Postmenopausal bleeding. Patient unaware of this   Statins Other (See Comments)    Muscle pain with diagnosed muscle damage   Doxycycline  Rash   Erythromycin Other (See Comments)    Caused crazy dreams   Seasonal Ic [Cholestatin] Other (See Comments)    Runny nose, itchy eyes   Sulfa Antibiotics Other (See Comments)    Unknown reaction. Patient unaware of when or what happened     Current Outpatient Medications  Medication Sig Dispense Refill   acetaminophen (TYLENOL) 500 MG tablet Take 1,000-1,500 mg by mouth 2 (two) times daily as needed for moderate pain or headache.     albuterol (PROVENTIL  HFA;VENTOLIN HFA) 108 (90 Base) MCG/ACT inhaler Inhale 2 puffs into the lungs every 6 (six) hours as needed for wheezing or shortness of breath.      aspirin EC 81 MG tablet Take 81 mg by mouth daily.      benzonatate (TESSALON) 100 MG capsule Take 100 mg by mouth 3 (three) times daily as needed for cough (for allergies).     Cholecalciferol (VITAMIN D3) 25 MCG (1000 UT) CAPS Take 1 capsule by mouth daily.     diphenhydrAMINE (BENADRYL) 25 MG tablet Take 25 mg by mouth every 6 (six) hours as needed for allergies.     DULoxetine (CYMBALTA) 60 MG capsule Take 60 mg by mouth daily.     fluticasone (FLONASE) 50 MCG/ACT nasal spray Place 2 sprays into both nostrils daily.     hydrochlorothiazide (HYDRODIURIL) 25 MG tablet Take 25 mg by mouth daily.     losartan (COZAAR) 100 MG tablet Take 100 mg by mouth daily.     meloxicam (MOBIC) 15 MG tablet Take 15 mg by mouth daily.     metoprolol succinate (TOPROL-XL) 50 MG 24 hr tablet Take 50 mg by mouth daily. In the morning     Polyethyl Glycol-Propyl Glycol (SYSTANE OP) Place 1 drop into both eyes daily as needed (dry eyes).     Turmeric 500 MG TABS Take 500 mg by mouth daily.     No current facility-administered medications for this visit.      REVIEW OF SYSTEMS (Negative unless checked)  Constitutional: [] Weight loss  [] Fever  [] Chills Cardiac: [] Chest pain   [] Chest pressure   [] Palpitations   [] Shortness of breath when laying flat   [] Shortness of breath at rest   [] Shortness of breath with exertion. Vascular:  [] Pain in legs with walking   [] Pain in legs at rest   [] Pain in legs when laying flat   [] Claudication   [] Pain in feet when walking  [] Pain in feet at rest  [] Pain in feet when laying flat   [] History of DVT   [] Phlebitis   [] Swelling in legs   [] Varicose veins   [] Non-healing ulcers Pulmonary:   [] Uses home oxygen   [] Productive cough   [] Hemoptysis   [] Wheeze  [] COPD   [] Asthma Neurologic:  [] Dizziness  [] Blackouts   [] Seizures    [] History of stroke   [] History of TIA  [] Aphasia   [] Temporary blindness   [] Dysphagia   [] Weakness or numbness in arms   [] Weakness or numbness in legs Musculoskeletal:  [x] Arthritis   [] Joint swelling   [] Joint pain   [] Low back pain Hematologic:  [] Easy bruising  [] Easy bleeding   [] Hypercoagulable state   [] Anemic  [] Hepatitis Gastrointestinal:  [] Blood in stool   [] Vomiting blood  []   Gastroesophageal reflux/heartburn   [] Abdominal pain Genitourinary:  [] Chronic kidney disease   [] Difficult urination  [] Frequent urination  [] Burning with urination   [] Hematuria Skin:  [] Rashes   [] Ulcers   [] Wounds Psychological:  [x] History of anxiety   []  History of major depression.    Physical Exam BP 133/72   Pulse 69   Ht 5' 2 (1.575 m)   Wt 299 lb (135.6 kg)   BMI 54.69 kg/m  Gen:  WD/WN, NAD. Appears younger than stated age. Head: March ARB/AT, No temporalis wasting.  Ear/Nose/Throat: Hearing grossly intact, nares w/o erythema or drainage, oropharynx w/o Erythema/Exudate Eyes: Conjunctiva clear, sclera non-icteric  Neck: trachea midline.  No JVD. Bilateral carotid bruits Pulmonary:  Good air movement, respirations not labored, no use of accessory muscles  Cardiac: RRR, no JVD Vascular:  Vessel Right Left  Radial Palpable Palpable                                   Gastrointestinal:. No masses, surgical incisions, or scars. Musculoskeletal: M/S 5/5 throughout.  Extremities without ischemic changes.  No deformity or atrophy. Trace LE edema. Neurologic: Sensation grossly intact in extremities.  Symmetrical.  Speech is fluent. Motor exam as listed above. Psychiatric: Judgment intact, Mood & affect appropriate for pt's clinical situation. Dermatologic: No rashes or ulcers noted.  No cellulitis or open wounds.    Radiology No results found.  Labs No results found for this or any previous visit (from the past 2160 hours).  Assessment/Plan:  Carotid stenosis her primary care provider  astutely ordered a carotid duplex performed earlier this month which I have reviewed.  This is interpreted as 50 to 69% bilateral ICA stenosis.  By our grading criteria, we would describe this as 40 to 59% bilateral carotid artery stenosis.  She is on aspirin therapy and should continue this.  She has intolerance to statins with myalgias.  We will continue to follow this and I will plan on seeing her in 16-month with a carotid duplex.  No role for intervention at this degree of stenosis, but we will follow this closely going forward.  Hypertension blood pressure control important in reducing the progression of atherosclerotic disease. On appropriate oral medications.   Lymphedema Swelling is currently under good control  Hyperlipidemia lipid control important in reducing the progression of atherosclerotic disease. Statin intolerant      Selinda Gu 08/31/2023, 10:45 AM   This note was created with Dragon medical transcription system.  Any errors from dictation are unintentional.

## 2023-08-31 NOTE — Assessment & Plan Note (Signed)
 her primary care provider astutely ordered a carotid duplex performed earlier this month which I have reviewed.  This is interpreted as 50 to 69% bilateral ICA stenosis.  By our grading criteria, we would describe this as 40 to 59% bilateral carotid artery stenosis.  She is on aspirin therapy and should continue this.  She has intolerance to statins with myalgias.  We will continue to follow this and I will plan on seeing her in 79-month with a carotid duplex.  No role for intervention at this degree of stenosis, but we will follow this closely going forward.

## 2023-08-31 NOTE — Assessment & Plan Note (Signed)
 blood pressure control important in reducing the progression of atherosclerotic disease. On appropriate oral medications.

## 2023-08-31 NOTE — Assessment & Plan Note (Signed)
lipid control important in reducing the progression of atherosclerotic disease. Statin intolerant

## 2023-09-05 DIAGNOSIS — G4733 Obstructive sleep apnea (adult) (pediatric): Secondary | ICD-10-CM | POA: Diagnosis not present

## 2023-10-02 ENCOUNTER — Ambulatory Visit
Admission: RE | Admit: 2023-10-02 | Discharge: 2023-10-02 | Disposition: A | Discharge status: Home / Self Care | Source: Ambulatory Visit | Attending: Gerontology | Admitting: Gerontology

## 2023-10-02 DIAGNOSIS — Z1231 Encounter for screening mammogram for malignant neoplasm of breast: Secondary | ICD-10-CM | POA: Diagnosis not present

## 2023-10-05 DIAGNOSIS — H903 Sensorineural hearing loss, bilateral: Secondary | ICD-10-CM | POA: Diagnosis not present

## 2023-10-10 DIAGNOSIS — M25562 Pain in left knee: Secondary | ICD-10-CM | POA: Diagnosis not present

## 2023-11-02 DIAGNOSIS — H40023 Open angle with borderline findings, high risk, bilateral: Secondary | ICD-10-CM | POA: Diagnosis not present

## 2023-11-07 DIAGNOSIS — I10 Essential (primary) hypertension: Secondary | ICD-10-CM | POA: Diagnosis not present

## 2023-11-07 DIAGNOSIS — I6523 Occlusion and stenosis of bilateral carotid arteries: Secondary | ICD-10-CM | POA: Diagnosis not present

## 2023-11-20 DIAGNOSIS — I10 Essential (primary) hypertension: Secondary | ICD-10-CM | POA: Diagnosis not present

## 2023-12-20 DIAGNOSIS — H40021 Open angle with borderline findings, high risk, right eye: Secondary | ICD-10-CM | POA: Diagnosis not present

## 2023-12-24 DIAGNOSIS — Z7689 Persons encountering health services in other specified circumstances: Secondary | ICD-10-CM | POA: Diagnosis not present

## 2023-12-24 DIAGNOSIS — I6523 Occlusion and stenosis of bilateral carotid arteries: Secondary | ICD-10-CM | POA: Diagnosis not present

## 2023-12-24 DIAGNOSIS — I10 Essential (primary) hypertension: Secondary | ICD-10-CM | POA: Diagnosis not present

## 2023-12-24 DIAGNOSIS — I350 Nonrheumatic aortic (valve) stenosis: Secondary | ICD-10-CM | POA: Diagnosis not present

## 2023-12-25 DIAGNOSIS — H35351 Cystoid macular degeneration, right eye: Secondary | ICD-10-CM | POA: Diagnosis not present

## 2024-01-09 DIAGNOSIS — Z6841 Body Mass Index (BMI) 40.0 and over, adult: Secondary | ICD-10-CM | POA: Diagnosis not present

## 2024-01-09 DIAGNOSIS — I350 Nonrheumatic aortic (valve) stenosis: Secondary | ICD-10-CM | POA: Diagnosis not present

## 2024-01-09 DIAGNOSIS — I6523 Occlusion and stenosis of bilateral carotid arteries: Secondary | ICD-10-CM | POA: Diagnosis not present

## 2024-01-11 DIAGNOSIS — Z6841 Body Mass Index (BMI) 40.0 and over, adult: Secondary | ICD-10-CM | POA: Diagnosis not present

## 2024-01-11 DIAGNOSIS — M170 Bilateral primary osteoarthritis of knee: Secondary | ICD-10-CM | POA: Diagnosis not present

## 2024-01-14 ENCOUNTER — Other Ambulatory Visit: Payer: Self-pay

## 2024-01-14 DIAGNOSIS — I6523 Occlusion and stenosis of bilateral carotid arteries: Secondary | ICD-10-CM

## 2024-01-14 DIAGNOSIS — I350 Nonrheumatic aortic (valve) stenosis: Secondary | ICD-10-CM

## 2024-01-17 ENCOUNTER — Encounter (HOSPITAL_COMMUNITY): Payer: Self-pay

## 2024-01-18 ENCOUNTER — Telehealth (HOSPITAL_COMMUNITY): Payer: Self-pay | Admitting: *Deleted

## 2024-01-18 NOTE — Telephone Encounter (Signed)
 Attempted to call patient regarding upcoming cardiac CT appointment. Left message on voicemail with name and callback number Sid Seats RN Navigator Cardiac Imaging [facility] [phone] Office

## 2024-01-18 NOTE — Telephone Encounter (Signed)
Patient returning call about her upcoming cardiac imaging study; pt verbalizes understanding of appt date/time, parking situation and where to check in, pre-test NPO status and verified current allergies; name and call back number provided for further questions should they arise  Larey Brick RN Navigator Cardiac Imaging [facility] [phone] office [phone] cell

## 2024-01-21 ENCOUNTER — Ambulatory Visit: Admission: RE | Admit: 2024-01-21 | Discharge: 2024-01-21

## 2024-01-21 DIAGNOSIS — I6523 Occlusion and stenosis of bilateral carotid arteries: Secondary | ICD-10-CM | POA: Diagnosis present

## 2024-01-21 DIAGNOSIS — I350 Nonrheumatic aortic (valve) stenosis: Secondary | ICD-10-CM

## 2024-01-21 MED ORDER — DILTIAZEM HCL 25 MG/5ML IV SOLN: 10.0000 mg | INTRAVENOUS | Status: DC | PRN

## 2024-01-21 MED ORDER — METOPROLOL TARTRATE 5 MG/5ML IV SOLN: 10.0000 mg | Freq: Once | INTRAVENOUS | Status: DC | PRN

## 2024-01-21 MED ORDER — IOHEXOL 350 MG/ML SOLN
100.0000 mL | Freq: Once | INTRAVENOUS | Status: AC | PRN
  Administered 2024-01-21: 11:00:00 100 mL via INTRAVENOUS

## 2024-01-23 ENCOUNTER — Encounter: Admission: RE | Disposition: A | Payer: Self-pay

## 2024-01-23 ENCOUNTER — Other Ambulatory Visit: Payer: Self-pay

## 2024-01-23 ENCOUNTER — Ambulatory Visit: Admission: RE | Admit: 2024-01-23 | Discharge: 2024-01-23 | Disposition: A

## 2024-01-23 DIAGNOSIS — I350 Nonrheumatic aortic (valve) stenosis: Secondary | ICD-10-CM | POA: Diagnosis present

## 2024-01-23 DIAGNOSIS — I6523 Occlusion and stenosis of bilateral carotid arteries: Secondary | ICD-10-CM | POA: Insufficient documentation

## 2024-01-23 DIAGNOSIS — I2510 Atherosclerotic heart disease of native coronary artery without angina pectoris: Secondary | ICD-10-CM | POA: Diagnosis not present

## 2024-01-23 DIAGNOSIS — Z8249 Family history of ischemic heart disease and other diseases of the circulatory system: Secondary | ICD-10-CM | POA: Insufficient documentation

## 2024-01-23 DIAGNOSIS — Z7982 Long term (current) use of aspirin: Secondary | ICD-10-CM | POA: Diagnosis not present

## 2024-01-23 DIAGNOSIS — Z6841 Body Mass Index (BMI) 40.0 and over, adult: Secondary | ICD-10-CM | POA: Insufficient documentation

## 2024-01-23 HISTORY — PX: LEFT HEART CATH AND CORONARY ANGIOGRAPHY: CATH118249

## 2024-01-23 LAB — COMPREHENSIVE METABOLIC PANEL WITH GFR
ALT: 45 U/L — ABNORMAL HIGH (ref 0–44)
AST: 67 U/L — ABNORMAL HIGH (ref 15–41)
Albumin: 4 g/dL (ref 3.5–5.0)
Alkaline Phosphatase: 58 U/L (ref 38–126)
Anion gap: 10 (ref 5–15)
BUN: 25 mg/dL — ABNORMAL HIGH (ref 8–23)
CO2: 25 mmol/L (ref 22–32)
Calcium: 9.7 mg/dL (ref 8.9–10.3)
Chloride: 101 mmol/L (ref 98–111)
Creatinine, Ser: 0.97 mg/dL (ref 0.44–1.00)
GFR, Estimated: >60 mL/min (ref >60)
Glucose, Bld: 105 mg/dL — ABNORMAL HIGH (ref 70–99)
Potassium: 4.3 mmol/L (ref 3.5–5.1)
Sodium: 137 mmol/L (ref 135–145)
Total Bilirubin: 0.3 mg/dL (ref 0.0–1.2)
Total Protein: 7.2 g/dL (ref 6.5–8.1)

## 2024-01-23 LAB — CBC
HCT: 35.3 % — ABNORMAL LOW (ref 36.0–46.0)
Hemoglobin: 11.9 g/dL — ABNORMAL LOW (ref 12.0–15.0)
MCH: 33.7 pg (ref 26.0–34.0)
MCHC: 33.7 g/dL (ref 30.0–36.0)
MCV: 100 fL (ref 80.0–100.0)
Platelets: 290 K/uL (ref 150–400)
RBC: 3.53 MIL/uL — ABNORMAL LOW (ref 3.87–5.11)
RDW: 13 % (ref 11.5–15.5)
WBC: 7.8 K/uL (ref 4.0–10.5)
nRBC: 0 % (ref 0.0–0.2)

## 2024-01-23 MED ORDER — ACETAMINOPHEN 325 MG PO TABS: 650.0000 mg | ORAL_TABLET | ORAL | Status: DC | PRN

## 2024-01-23 MED ORDER — SODIUM CHLORIDE 0.9% FLUSH: 3.0000 mL | Freq: Two times a day (BID) | INTRAVENOUS | Status: DC

## 2024-01-23 MED ORDER — HEPARIN SODIUM (PORCINE) 1000 UNIT/ML IJ SOLN
INTRAMUSCULAR | Status: DC | PRN
  Administered 2024-01-23: 11:00:00 5000 [IU] via INTRAVENOUS

## 2024-01-23 MED ORDER — SODIUM CHLORIDE 0.9 % IV SOLN: 250.0000 mL | INTRAVENOUS | Status: DC | PRN

## 2024-01-23 MED ORDER — MIDAZOLAM HCL (PF) 2 MG/2ML IJ SOLN
INTRAMUSCULAR | Status: DC | PRN
  Administered 2024-01-23: 11:00:00 1 mg via INTRAVENOUS

## 2024-01-23 MED ORDER — VERAPAMIL HCL 2.5 MG/ML IV SOLN
INTRAVENOUS | Status: DC | PRN
  Administered 2024-01-23: 11:00:00 2.5 mg via INTRACORONARY

## 2024-01-23 MED ORDER — SODIUM CHLORIDE 0.9% FLUSH: 3.0000 mL | INTRAVENOUS | Status: DC | PRN

## 2024-01-23 MED ORDER — FREE WATER: 500.0000 mL | Freq: Once | Status: DC

## 2024-01-23 MED ORDER — IOHEXOL 300 MG/ML  SOLN
INTRAMUSCULAR | Status: DC | PRN
  Administered 2024-01-23: 11:00:00 40 mL

## 2024-01-23 MED ORDER — ASPIRIN 81 MG PO CHEW: 81.0000 mg | CHEWABLE_TABLET | ORAL | Status: DC

## 2024-01-23 MED ORDER — SODIUM CHLORIDE 0.9 % IV SOLN
250.0000 mL | INTRAVENOUS | Status: DC | PRN
  Administered 2024-01-23: 10:00:00 250 mL via INTRAVENOUS

## 2024-01-23 MED ORDER — ONDANSETRON HCL 4 MG/2ML IJ SOLN: 4.0000 mg | Freq: Four times a day (QID) | INTRAMUSCULAR | Status: DC | PRN

## 2024-01-23 MED ORDER — FENTANYL CITRATE (PF) 100 MCG/2ML IJ SOLN
INTRAMUSCULAR | Status: AC
  Filled 2024-01-23: qty 2

## 2024-01-23 MED ORDER — FREE WATER
500.0000 mL | Freq: Once | Status: AC
  Administered 2024-01-23: 12:00:00 500 mL via ORAL

## 2024-01-23 MED ORDER — LIDOCAINE HCL (PF) 1 % IJ SOLN
INTRAMUSCULAR | Status: DC | PRN
  Administered 2024-01-23: 11:00:00 5 mL

## 2024-01-23 MED ORDER — HYDRALAZINE HCL 20 MG/ML IJ SOLN: 10.0000 mg | INTRAMUSCULAR | Status: DC | PRN

## 2024-01-23 MED ORDER — FENTANYL CITRATE (PF) 100 MCG/2ML IJ SOLN
INTRAMUSCULAR | Status: DC | PRN
  Administered 2024-01-23: 11:00:00 50 ug via INTRAVENOUS

## 2024-01-23 MED ORDER — HEPARIN (PORCINE) IN NACL 1000-0.9 UT/500ML-% IV SOLN
INTRAVENOUS | Status: DC | PRN
  Administered 2024-01-23: 11:00:00 1000 mL

## 2024-01-23 MED FILL — Verapamil HCl IV Soln 2.5 MG/ML: INTRAVENOUS | Qty: 2 | Status: AC

## 2024-01-23 MED FILL — Heparin Sodium (Porcine) Inj 1000 Unit/ML: INTRAMUSCULAR | Qty: 10 | Status: AC

## 2024-01-23 MED FILL — Heparin Sod (Porcine)-NaCl IV Soln 1000 Unit/500ML-0.9%: INTRAVENOUS | Qty: 1000 | Status: AC

## 2024-01-23 MED FILL — Midazolam HCl Inj 2 MG/2ML (Base Equivalent): INTRAMUSCULAR | Qty: 2 | Status: AC

## 2024-01-23 MED FILL — Lidocaine HCl Local Inj 1%: INTRAMUSCULAR | Qty: 20 | Status: AC

## 2024-03-04 ENCOUNTER — Encounter (INDEPENDENT_AMBULATORY_CARE_PROVIDER_SITE_OTHER)

## 2024-03-04 ENCOUNTER — Ambulatory Visit (INDEPENDENT_AMBULATORY_CARE_PROVIDER_SITE_OTHER): Admitting: Vascular Surgery

## 2024-03-31 ENCOUNTER — Encounter (INDEPENDENT_AMBULATORY_CARE_PROVIDER_SITE_OTHER)

## 2024-03-31 ENCOUNTER — Ambulatory Visit (INDEPENDENT_AMBULATORY_CARE_PROVIDER_SITE_OTHER): Admitting: Nurse Practitioner
